# Patient Record
Sex: Female | Born: 1980
Health system: Southern US, Community
[De-identification: ages and names within clinical notes are randomized; demographics above are authoritative.]

## PROBLEM LIST (undated history)

## (undated) DIAGNOSIS — R030 Elevated blood-pressure reading, without diagnosis of hypertension: Secondary | ICD-10-CM

## (undated) DIAGNOSIS — I10 Essential (primary) hypertension: Secondary | ICD-10-CM

## (undated) DIAGNOSIS — Z8759 Personal history of other complications of pregnancy, childbirth and the puerperium: Secondary | ICD-10-CM

## (undated) DIAGNOSIS — IMO0001 Reserved for inherently not codable concepts without codable children: Secondary | ICD-10-CM

## (undated) HISTORY — PX: INDUCED ABORTION: SHX677

## (undated) HISTORY — DX: Personal history of other complications of pregnancy, childbirth and the puerperium: Z87.59

## (undated) HISTORY — DX: Essential (primary) hypertension: I10

## (undated) HISTORY — DX: Elevated blood-pressure reading, without diagnosis of hypertension: R03.0

## (undated) HISTORY — DX: Reserved for inherently not codable concepts without codable children: IMO0001

## (undated) HISTORY — PX: REDUCTION MAMMAPLASTY: SUR839

## (undated) HISTORY — PX: BREAST SURGERY: SHX581

---

## 1999-09-03 ENCOUNTER — Emergency Department (HOSPITAL_COMMUNITY): Admission: EM | Admit: 1999-09-03 | Discharge: 1999-09-03 | Payer: Self-pay | Admitting: Emergency Medicine

## 1999-09-03 ENCOUNTER — Encounter: Payer: Self-pay | Admitting: Emergency Medicine

## 2001-04-24 ENCOUNTER — Encounter (INDEPENDENT_AMBULATORY_CARE_PROVIDER_SITE_OTHER): Payer: Self-pay | Admitting: Specialist

## 2001-04-24 ENCOUNTER — Ambulatory Visit (HOSPITAL_BASED_OUTPATIENT_CLINIC_OR_DEPARTMENT_OTHER): Admission: RE | Admit: 2001-04-24 | Discharge: 2001-04-25 | Payer: Self-pay | Admitting: Plastic Surgery

## 2001-07-25 ENCOUNTER — Other Ambulatory Visit: Admission: RE | Admit: 2001-07-25 | Discharge: 2001-07-25 | Payer: Self-pay | Admitting: *Deleted

## 2002-07-25 ENCOUNTER — Other Ambulatory Visit: Admission: RE | Admit: 2002-07-25 | Discharge: 2002-07-25 | Payer: Self-pay | Admitting: *Deleted

## 2005-10-07 ENCOUNTER — Ambulatory Visit: Payer: Self-pay | Admitting: Family Medicine

## 2006-07-06 ENCOUNTER — Ambulatory Visit: Payer: Self-pay | Admitting: Family Medicine

## 2006-08-07 ENCOUNTER — Ambulatory Visit: Payer: Self-pay | Admitting: Family Medicine

## 2007-09-24 ENCOUNTER — Ambulatory Visit: Payer: Self-pay | Admitting: Internal Medicine

## 2008-01-21 ENCOUNTER — Telehealth (INDEPENDENT_AMBULATORY_CARE_PROVIDER_SITE_OTHER): Payer: Self-pay | Admitting: *Deleted

## 2008-01-21 ENCOUNTER — Ambulatory Visit: Payer: Self-pay | Admitting: Internal Medicine

## 2008-01-21 DIAGNOSIS — R030 Elevated blood-pressure reading, without diagnosis of hypertension: Secondary | ICD-10-CM | POA: Insufficient documentation

## 2008-01-22 ENCOUNTER — Telehealth: Payer: Self-pay | Admitting: Internal Medicine

## 2008-01-22 ENCOUNTER — Encounter: Payer: Self-pay | Admitting: Internal Medicine

## 2008-01-22 ENCOUNTER — Telehealth (INDEPENDENT_AMBULATORY_CARE_PROVIDER_SITE_OTHER): Payer: Self-pay | Admitting: *Deleted

## 2008-03-07 ENCOUNTER — Ambulatory Visit: Payer: Self-pay | Admitting: Internal Medicine

## 2008-03-07 DIAGNOSIS — E049 Nontoxic goiter, unspecified: Secondary | ICD-10-CM | POA: Insufficient documentation

## 2008-03-12 ENCOUNTER — Encounter (INDEPENDENT_AMBULATORY_CARE_PROVIDER_SITE_OTHER): Payer: Self-pay | Admitting: *Deleted

## 2008-03-12 LAB — CONVERTED CEMR LAB
Free T4: 0.8 ng/dL (ref 0.6–1.6)
Sed Rate: 9 mm/hr (ref 0–22)
T3 Uptake Ratio: 32.6 % (ref 22.5–37.0)
T3, Free: 3.4 pg/mL (ref 2.3–4.2)
TSH: 0.92 microintl units/mL (ref 0.35–5.50)

## 2008-03-13 ENCOUNTER — Encounter: Admission: RE | Admit: 2008-03-13 | Discharge: 2008-03-13 | Payer: Self-pay | Admitting: Internal Medicine

## 2008-03-17 ENCOUNTER — Telehealth (INDEPENDENT_AMBULATORY_CARE_PROVIDER_SITE_OTHER): Payer: Self-pay | Admitting: *Deleted

## 2008-03-25 ENCOUNTER — Encounter: Payer: Self-pay | Admitting: Internal Medicine

## 2008-03-25 ENCOUNTER — Other Ambulatory Visit: Admission: RE | Admit: 2008-03-25 | Discharge: 2008-03-25 | Payer: Self-pay | Admitting: Interventional Radiology

## 2008-03-25 ENCOUNTER — Encounter: Admission: RE | Admit: 2008-03-25 | Discharge: 2008-03-25 | Payer: Self-pay | Admitting: Internal Medicine

## 2008-03-25 ENCOUNTER — Encounter (INDEPENDENT_AMBULATORY_CARE_PROVIDER_SITE_OTHER): Payer: Self-pay | Admitting: Interventional Radiology

## 2008-04-09 ENCOUNTER — Telehealth: Payer: Self-pay | Admitting: Internal Medicine

## 2008-04-10 ENCOUNTER — Encounter (INDEPENDENT_AMBULATORY_CARE_PROVIDER_SITE_OTHER): Payer: Self-pay | Admitting: *Deleted

## 2008-05-27 ENCOUNTER — Encounter: Payer: Self-pay | Admitting: Internal Medicine

## 2008-06-23 ENCOUNTER — Ambulatory Visit: Payer: Self-pay | Admitting: Internal Medicine

## 2008-06-23 DIAGNOSIS — J4520 Mild intermittent asthma, uncomplicated: Secondary | ICD-10-CM | POA: Insufficient documentation

## 2008-06-27 ENCOUNTER — Telehealth: Payer: Self-pay | Admitting: Internal Medicine

## 2008-07-07 ENCOUNTER — Encounter: Payer: Self-pay | Admitting: Internal Medicine

## 2008-07-30 ENCOUNTER — Ambulatory Visit: Payer: Self-pay | Admitting: Family Medicine

## 2008-08-29 ENCOUNTER — Ambulatory Visit (HOSPITAL_COMMUNITY): Admission: RE | Admit: 2008-08-29 | Discharge: 2008-08-30 | Payer: Self-pay | Admitting: General Surgery

## 2008-08-29 ENCOUNTER — Encounter (INDEPENDENT_AMBULATORY_CARE_PROVIDER_SITE_OTHER): Payer: Self-pay | Admitting: General Surgery

## 2008-08-29 ENCOUNTER — Encounter: Payer: Self-pay | Admitting: Internal Medicine

## 2008-09-15 ENCOUNTER — Encounter: Payer: Self-pay | Admitting: Internal Medicine

## 2008-10-30 HISTORY — PX: THYROIDECTOMY: SHX17

## 2008-11-04 ENCOUNTER — Ambulatory Visit: Payer: Self-pay | Admitting: Family Medicine

## 2009-01-02 ENCOUNTER — Ambulatory Visit: Payer: Self-pay | Admitting: Family Medicine

## 2009-05-21 ENCOUNTER — Telehealth (INDEPENDENT_AMBULATORY_CARE_PROVIDER_SITE_OTHER): Payer: Self-pay | Admitting: *Deleted

## 2009-05-22 ENCOUNTER — Encounter (INDEPENDENT_AMBULATORY_CARE_PROVIDER_SITE_OTHER): Payer: Self-pay | Admitting: *Deleted

## 2009-05-28 ENCOUNTER — Ambulatory Visit: Payer: Self-pay | Admitting: Family Medicine

## 2009-05-28 DIAGNOSIS — E890 Postprocedural hypothyroidism: Secondary | ICD-10-CM | POA: Insufficient documentation

## 2009-05-28 DIAGNOSIS — F32A Depression, unspecified: Secondary | ICD-10-CM | POA: Insufficient documentation

## 2009-05-28 DIAGNOSIS — F329 Major depressive disorder, single episode, unspecified: Secondary | ICD-10-CM

## 2009-05-28 DIAGNOSIS — F419 Anxiety disorder, unspecified: Secondary | ICD-10-CM

## 2009-05-29 ENCOUNTER — Encounter: Payer: Self-pay | Admitting: Family Medicine

## 2009-05-29 LAB — CONVERTED CEMR LAB: TSH: 4.63 microintl units/mL (ref 0.35–5.50)

## 2009-06-25 ENCOUNTER — Telehealth (INDEPENDENT_AMBULATORY_CARE_PROVIDER_SITE_OTHER): Payer: Self-pay | Admitting: *Deleted

## 2009-10-19 ENCOUNTER — Ambulatory Visit: Payer: Self-pay | Admitting: Family Medicine

## 2009-10-19 ENCOUNTER — Telehealth (INDEPENDENT_AMBULATORY_CARE_PROVIDER_SITE_OTHER): Payer: Self-pay | Admitting: *Deleted

## 2009-10-20 LAB — CONVERTED CEMR LAB: TSH: 2.05 microintl units/mL (ref 0.35–5.50)

## 2010-01-07 ENCOUNTER — Ambulatory Visit: Payer: Self-pay | Admitting: Family Medicine

## 2010-01-25 ENCOUNTER — Telehealth (INDEPENDENT_AMBULATORY_CARE_PROVIDER_SITE_OTHER): Payer: Self-pay | Admitting: *Deleted

## 2010-01-25 ENCOUNTER — Ambulatory Visit: Payer: Self-pay | Admitting: Family Medicine

## 2010-01-28 LAB — CONVERTED CEMR LAB: TSH: 3.73 microintl units/mL (ref 0.35–5.50)

## 2010-10-03 ENCOUNTER — Encounter: Payer: Self-pay | Admitting: Endocrinology

## 2010-10-12 NOTE — Assessment & Plan Note (Signed)
Summary: ? SINUS INF/RH........Marland Kitchen   Vital Signs:  Patient profile:   30 year old female Height:      68 inches Weight:      234 pounds BMI:     35.71 Temp:     98.4 degrees F oral Pulse rate:   88 / minute Pulse rhythm:   regular BP sitting:   126 / 80  (left arm) Cuff size:   large  Vitals Entered By: Army Fossa CMA (January 07, 2010 1:49 PM) CC: Pt here for HA, sinus pressure, congestion x 2 days. , URI symptoms   History of Present Illness:       This is a 30 year old woman who presents with URI symptoms.  The symptoms began 4 days ago.  The patient complains of nasal congestion, purulent nasal discharge, productive cough, earache, and sick contacts.  The patient denies fever, low-grade fever (<100.5 degrees), fever of 100.5-103 degrees, fever of 103.1-104 degrees, fever to >104 degrees, stiff neck, dyspnea, wheezing, rash, vomiting, diarrhea, use of an antipyretic, and response to antipyretic.  The patient also reports itchy watery eyes and headache.  The patient denies itchy throat, sneezing, seasonal symptoms, response to antihistamine, muscle aches, and severe fatigue.  The patient denies the following risk factors for Strep sinusitis: unilateral facial pain, unilateral nasal discharge, poor response to decongestant, double sickening, tooth pain, Strep exposure, tender adenopathy, and absence of cough.  Pt taking zyrtec with litttle relief and b/l sinus pressure.  Allergies: 1)  ! 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 CAD - F (deceased age 40) HTN - M family stroke - F DM - M aunt breast Ca - no lung Ca - great uncle colon Ca - no ovarian/uterine Ca -no thyroid dz - no  Social History: Reviewed history from 04/06/08 and no changes required. 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   Ears:  External ear exam shows no significant lesions or deformities.  Otoscopic examination reveals clear canals, tympanic membranes are intact bilaterally without bulging, retraction, inflammation or discharge. Hearing is grossly normal bilaterally. Nose:  L frontal sinus tenderness, L maxillary sinus tenderness, R frontal sinus tenderness, and R maxillary sinus tenderness.   Mouth:  Oral mucosa and oropharynx without lesions or exudates.  Teeth in good repair. Neck:  cervical lymphadenopathy.   Lungs:  Normal respiratory effort, chest expands symmetrically. Lungs are clear to auscultation, no crackles or wheezes. Heart:  normal rate and no murmur.   Psych:  Oriented X3 and normally interactive.     Impression & Recommendations:  Problem # 1:  SINUSITIS- ACUTE-NOS (ICD-461.9)  The following medications were removed from the medication list:    Tussionex Pennkinetic Er 8-10 Mg/36ml Lqcr (Chlorpheniramine-hydrocodone) .Marland Kitchen... 1 tsp by mouth at bedtime  as needed Her updated medication list for this problem includes:    Flonase 50 Mcg/act Susp (Fluticasone propionate) .Marland Kitchen... 2 sprays each nostril once daily    Augmentin 875-125 Mg Tabs (Amoxicillin-pot clavulanate) .Marland Kitchen... 1 by mouth two times a day    Cheratussin Ac 100-10 Mg/28ml Syrp (Guaifenesin-codeine) .Marland Kitchen...  1-2 tsp by mouth at bedtime as needed cough  Instructed on treatment. Call if symptoms persist or worsen.   Complete Medication List: 1)  Flonase 50 Mcg/act Susp (Fluticasone propionate) .... 2 sprays each nostril once daily 2)  Synthroid 125 Mcg Tabs (Levothyroxine sodium) .... Take 1 tablet by mouth once a day 3)  Zyrtec Allergy 10 Mg Tabs (Cetirizine hcl) .... Take one tablet daily as needed. 4)  Allergy Shots  5)  Bcp  6)  Augmentin 875-125 Mg Tabs (Amoxicillin-pot clavulanate) .Marland Kitchen.. 1 by mouth two times a day 7)  Cheratussin Ac 100-10 Mg/39ml Syrp (Guaifenesin-codeine) .Marland Kitchen.. 1-2 tsp by mouth at bedtime as needed cough Prescriptions: CHERATUSSIN AC 100-10 MG/5ML SYRP (GUAIFENESIN-CODEINE) 1-2 tsp by mouth at bedtime as needed cough  #6 oz x 0   Entered and Authorized by:   Loreen Freud DO   Signed by:   Loreen Freud DO on 01/07/2010   Method used:   Print then Give to Patient   RxID:   [id number] AUGMENTIN 875-125 MG TABS (AMOXICILLIN-POT CLAVULANATE) 1 by mouth two times a day  #20 x 0   Entered and Authorized by:   Loreen Freud DO   Signed by:   Loreen Freud DO on 01/07/2010   Method used:   Electronically to        [facility] [phone]* (retail)       [address]       [place], [place]  [address]       Ph: [phone]       Fax: [phone]   RxID:   [id number] FLONASE 50 MCG/ACT SUSP (FLUTICASONE PROPIONATE) 2 sprays each nostril once daily  #1 x 5   Entered and Authorized by:   Loreen Freud DO   Signed by:   Loreen Freud DO on 01/07/2010   Method used:   Electronically to        [facility] [phone]* (retail)       [address]       [place], [place]  [address]       Ph: [phone]       Fax: [phone]   RxID:   [id number]

## 2010-10-12 NOTE — Progress Notes (Signed)
Summary: refill synthroid with tsh results  Phone Note Call from Patient Call back at Work Phone [phone]   Caller: Patient Reason for Call: Refill Medication Summary of Call: PT. CAME IN AND WANTING [place] TO KNOW THAT SHE HAD HER THYROID CHECK. NOW SHE NEED ED HER REFILL CALL IN.  Initial call taken by: Freddy Jaksch,  October 19, 2009 2:40 PM  Follow-up for Phone Call        will refill when we get results Follow-up by: Shary Decamp,  October 19, 2009 4:53 PM  Additional Follow-up for Phone Call Additional follow up Details #1::        con't same dose  Additional Follow-up by: Loreen Freud DO,  October 20, 2009 4:44 PM    Additional Follow-up for Phone Call Additional follow up Details #2::    left message for pt- sent in rx's. Army Fossa CMA  October 20, 2009 4:53 PM   Prescriptions: SYNTHROID 125 MCG TABS (LEVOTHYROXINE SODIUM) Take 1 tablet by mouth once a day  #30 x 2   Entered by:   Army Fossa CMA   Authorized by:   Loreen Freud DO   Signed by:   Army Fossa CMA on 10/20/2009   Method used:   Electronically to        [facility] [phone]* (retail)       [address]       [place], [place]  [address]       Ph: [phone]       Fax: [phone]   RxID:   [id number]

## 2010-10-12 NOTE — Progress Notes (Signed)
Summary: Lab Work  Phone Note Call from Patient Call back at [employer] [phone]   Caller: Patient Reason for Call: Lab or Test Results Summary of Call: Patient was going to her pharmacy to get her synthriod filled. They said she needed blood work done. Just a TSH? and what code? Initial call taken by: Harold Barban,  Jan 25, 2010 9:44 AM  Follow-up for Phone Call        tsh 244.9 Follow-up by: Shary Decamp,  Jan 25, 2010 4:57 PM

## 2010-10-19 ENCOUNTER — Ambulatory Visit: Payer: Self-pay | Admitting: Internal Medicine

## 2010-10-20 ENCOUNTER — Encounter: Payer: Self-pay | Admitting: Internal Medicine

## 2010-10-20 ENCOUNTER — Ambulatory Visit (INDEPENDENT_AMBULATORY_CARE_PROVIDER_SITE_OTHER): Payer: BC Managed Care – PPO | Admitting: Internal Medicine

## 2010-10-20 DIAGNOSIS — J45909 Unspecified asthma, uncomplicated: Secondary | ICD-10-CM

## 2010-10-22 ENCOUNTER — Ambulatory Visit: Payer: Self-pay | Admitting: Family Medicine

## 2010-10-28 NOTE — Assessment & Plan Note (Signed)
Summary: sinus, cough///sph-rescd/cbs  Nurse Visit   Vital Signs:  Patient profile:   30 year old female Height:      68 inches Weight:      202.25 pounds BMI:     30.86 Temp:     98.1 degrees F oral Pulse rate:   78 / minute Pulse rhythm:   regular BP sitting:   130 / 84  (left arm) Cuff size:   large  Vitals Entered By: Army Fossa CMA (October 20, 2010 4:01 PM)  History of Present Illness: started with respiratory symptoms 5 days ago Cough with some yellow sputum, sinus pressure, unable to sleep due to cough in the last couple of days  ROS No fever Had sore throat last week but that is better Her asthma is usually well controlled, she only develops problems when she has a  infection. At this point she does feel chest congestion and the feels like she " could use albuterol" no nausea, vomiting, diarrhea   Physical Exam  General:  alert and well-developed.  no apparent distress Head:  face symmetric Ears:  R ear normal and L ear normal.   Nose:  slightly congested Mouth:  no redness or discharge Lungs:  normal respiratory effort, no intercostal retractions, and no accessory muscle use.  slightly decreased breath sounds but no wheezing, crackles, no increase work of breathing Heart:  normal rate and no murmur.     Impression & Recommendations:  Problem # 1:  ASTHMA (ICD-493.90)  bronchitis with a mild asthma exacerbation. see instructions   Her updated medication list for this problem includes:    Ventolin Hfa 108 (90 Base) Mcg/act Aers (Albuterol sulfate) .Marland Kitchen... 2 puffs every 6 hours as needed wheezing  Complete Medication List: 1)  Flonase 50 Mcg/act Susp (Fluticasone propionate) .... 2 sprays each nostril once daily 2)  Synthroid 125 Mcg Tabs (Levothyroxine sodium) .... Take 1 tablet by mouth once a day 3)  Zyrtec Allergy 10 Mg Tabs (Cetirizine hcl) .... Take one tablet daily as needed. 4)  Allergy Shots  5)  Bcp  6)  Zithromax Z-pak 250 Mg Tabs  (Azithromycin) .... As direceted 7)  Hydromet 5-1.5 Mg/28ml Syrp (Hydrocodone-homatropine) .Marland Kitchen.. 1 tsp by mouth every 6 hours as needed for cough 8)  Ventolin Hfa 108 (90 Base) Mcg/act Aers (Albuterol sulfate) .... 2 puffs every 6 hours as needed wheezing   Patient Instructions: 1)  rest, fluids, Tylenol 2)  Mucinex DM as needed for cough 3)  Hydrocodone if the cough is severe , will cause drowsiness 4)  Zithromax for 5 days 5)   albuterolas needed for wheezing 6)  Call if not better in a few days  CC: Sinus Infection?  Comments x 3weeks coughing up mucus (green) taking OTC meds Can she get B12 injection today? [facility] [address]    Current Medications (verified): 1)  Flonase 50 Mcg/act Susp (Fluticasone Propionate) .... 2 Sprays Each Nostril Once Daily 2)  Synthroid 125 Mcg Tabs (Levothyroxine Sodium) .... Take 1 Tablet By Mouth Once A Day 3)  Zyrtec Allergy 10 Mg Tabs (Cetirizine Hcl) .... Take One Tablet Daily As Needed. 4)  Allergy Shots 5)  Bcp  Allergies (verified): 1)  ! Sulfa  Past History:  Past Medical History: Reviewed history from 06/23/2008 and no changes required. asthma elevated BP G3 P0 abortion x 3  Past Surgical History: Reviewed history from 11/04/2008 and no changes required. 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 Patient Level III [16109] Prescriptions: ZITHROMAX Z-PAK 250 MG TABS (AZITHROMYCIN) as direceted  #1 x 0   Entered and Authorized by:   Nolon Rod. Paz MD   Signed by:   Nolon Rod. Paz MD on 10/20/2010   Method used:   Print then Give to Patient   RxID:   [id number] VENTOLIN HFA 108 (90 BASE) MCG/ACT AERS (ALBUTEROL SULFATE) 2 puffs every 6 hours as needed wheezing  #1 x 1   Entered and Authorized by:   Nolon Rod. Paz MD   Signed by:   Nolon Rod. Paz MD on 10/20/2010   Method used:   Print then Give to Patient   RxID:    [id number] HYDROMET 5-1.5 MG/5ML SYRP (HYDROCODONE-HOMATROPINE) 1 tsp by mouth every 6 hours as needed for cough  #150cc x 0   Entered and Authorized by:   Nolon Rod. Paz MD   Signed by:   Nolon Rod. Paz MD on 10/20/2010   Method used:   Print then Give to Patient   RxID:   [id number]

## 2011-01-12 ENCOUNTER — Encounter: Payer: Self-pay | Admitting: Family Medicine

## 2011-01-13 ENCOUNTER — Encounter: Payer: Self-pay | Admitting: Family Medicine

## 2011-01-13 ENCOUNTER — Ambulatory Visit (INDEPENDENT_AMBULATORY_CARE_PROVIDER_SITE_OTHER): Payer: BC Managed Care – PPO | Admitting: Family Medicine

## 2011-01-13 VITALS — BP 98/68 | Temp 97.9°F | Wt 205.6 lb

## 2011-01-13 DIAGNOSIS — Z889 Allergy status to unspecified drugs, medicaments and biological substances status: Secondary | ICD-10-CM

## 2011-01-13 DIAGNOSIS — J329 Chronic sinusitis, unspecified: Secondary | ICD-10-CM

## 2011-01-13 DIAGNOSIS — E039 Hypothyroidism, unspecified: Secondary | ICD-10-CM

## 2011-01-13 DIAGNOSIS — Z9109 Other allergy status, other than to drugs and biological substances: Secondary | ICD-10-CM

## 2011-01-13 MED ORDER — AMOXICILLIN-POT CLAVULANATE 875-125 MG PO TABS: 1.0000 | ORAL_TABLET | Freq: Two times a day (BID) | ORAL | Status: AC

## 2011-01-13 MED ORDER — FLUTICASONE PROPIONATE 50 MCG/ACT NA SUSP: 2.0000 | Freq: Every day | NASAL | Status: DC

## 2011-01-13 NOTE — Progress Notes (Signed)
  Subjective:     Erin [name] is a 30 y.o. female who presents for evaluation of sinus pain. Symptoms include: congestion, facial pain, nasal congestion and sinus pressure. Onset of symptoms was 1 month ago. Symptoms have been gradually worsening since that time. Past history is significant for allergies. Patient is a non-smoker.  The following portions of the patient's history were reviewed and updated as appropriate: allergies, current medications, past family history, past medical history, past social history, past surgical history and problem list.  Review of Systems Pertinent items are noted in HPI.   Objective:    BP 98/68  Temp(Src) 97.9 F (36.6 C) (Oral)  Wt 205 lb 9.6 oz (93.26 kg) General appearance: alert, cooperative, appears stated age and no distress Ears: normal TM's and external ear canals both ears Nose: Nares normal. Septum midline. Mucosa normal. No drainage or sinus tenderness., sinus tenderness bilateral Throat: lips, mucosa, and tongue normal; teeth and gums normal Neck: no adenopathy, no carotid bruit, no JVD, supple, symmetrical, trachea midline and thyroid not enlarged, symmetric, no tenderness/mass/nodules Lungs: clear to auscultation bilaterally Lymph nodes: Cervical, supraclavicular, and axillary nodes normal.    Assessment:    Acute bacterial sinusitis.    Plan:    Nasal steroids per medication orders. Antihistamines per medication orders. Augmentin per medication orders.

## 2011-01-14 ENCOUNTER — Encounter: Payer: Self-pay | Admitting: Family Medicine

## 2011-01-17 ENCOUNTER — Encounter: Payer: Self-pay | Admitting: *Deleted

## 2011-01-25 ENCOUNTER — Other Ambulatory Visit: Payer: Self-pay | Admitting: Family Medicine

## 2011-01-25 NOTE — Op Note (Signed)
NAME:  Erin [name], Erin [name]                 ACCOUNT NO.:  [id number]   MEDICAL RECORD NO.:  [id number]          PATIENT TYPE:  OIB   LOCATION:  1528                         FACILITY:  [facility]   PHYSICIAN:  Adolph Pollack, M.D.DATE OF BIRTH:  06/21/1981   DATE OF PROCEDURE:  08/29/2008  DATE OF DISCHARGE:                               OPERATIVE REPORT   PREOPERATIVE DIAGNOSIS:  Multinodular goiter with left-sided follicular  neoplasm.   POSTOPERATIVE DIAGNOSIS:  Multinodular goiter with left-sided follicular  neoplasm.   OPERATION PERFORMED:  Total thyroidectomy.   SURGEON:  Adolph Pollack, M.D.   ASSISTANT:  Wilmon Arms. Tsuei, M.D.   ANESTHESIA:  General.   INDICATIONS:  This 30 year old female has a known multinodular goiter  and had one suspicious left upper pole nodule noted that was biopsied  and was consistent with a follicular neoplasm with the possibility of a  follicular variant of a  papillary carcinoma.  She thus presents for  total thyroidectomy given multinodular goiter as well.   TECHNIQUE:  The patient was brought to the operating room, placed supine  on the operating table and a general anesthetic was administered.  The  neck was placed in slight extension.  The neck and upper chest were  sterilely prepped and draped.  A transverse incision was made in the  lower neck through the skin, subcutaneous tissue and platysma muscle.  Subplatysmal flaps were raised to the thyroid cartilage superiorly and  down to the suprasternal notch inferiorly.  The fascia between the right  left strap muscle was identified and divided.  The strap muscle then  bluntly dissected free from the enlarged left lobe of the thyroid gland.  I began at the inferior aspect of the thyroid gland and mobilized the  thyroid gland with dissection on the gland, and dividing the inferior  thyroidal vessels with hemoclips and the harmonic scalpel.  I then  mobilized the superior pole of thyroid gland,  which was quite superior,  dividing the small vessels between clips and ties, and using the  harmonic scalpel.  During this dissection I saw a superior parathyroid  gland, which I preserved.   This lobe of the left thyroid gland was quite large.  I then identified  the medial thyroidal vessels, dividing them close to the thyroid gland  with the clips and the harmonic scalpel.  These dropped away and I  identified the left recurrent laryngeal nerve, which I traced underneath  the vessels and it did not appear to be injured.  After doing this I  then dissected the enlarged left lobe of the thyroid gland free from the  trachea using electrocautery across the midline.  I had marked the  superior lobe with a long 2-0 silk suture on the left side.  Hemostasis  was adequate at this time and I placed a dry sponge in the left side of  the neck.   I then approached the right side of the neck and dissected the strap  muscle free from a moderately enlarged right lobe of the thyroid gland  using blunt dissection.  Beginning inferiorly I dissected the thyroid  gland free from the inferior thyroidal vessels, staying close to the  thyroid gland.  It was quite multinodular.   After mobilizing the inferior aspect the gland I approached the superior  pole, identified superior pole vessels and divided these between clips  and ties mobilizing this portion.  Staying on the thyroid gland I  identified the middle thyroidal vessels and divided these between clips  and using a harmonic scalpel.  I identified a superior parathyroid  gland, but was not able to identify an inferior parathyroid gland on the  right side similar to the left side.  The recurrent laryngeal nerve was  identified fairly close to a small lip of thyroid tissue.  Using  electrocautery and I then dissected the thyroid gland free from the  trachea leaving a very small amount of thyroid tissue behind where it  was close to the recurrent  laryngeal nerve, which was intact.  The  specimen was subsequently handed off the field.   I placed a dry sponge into the right side of the neck and reinspected  the left side of the neck.  There was a small amount of bleeding from a  vein very close to the recurrent laryngeal nerve and I stopped this with  direct pressure and Surgicel.  This was after wound irrigation.  No  further bleeding was noted from this area.   I then approached the right side, irrigated and it was hemostatic.  Surgicel was likewise placed in the right side.  No bleeding was noted  at this time from either side.   I then reapproximated the strap muscles with interrupted 3-0 Vicryl  sutures.  I irrigated out the subcutaneous tissue and a few bleeding  points were stopped with cautery.  I then reapproximated the platysma  muscle with interrupted 3-0 Vicryl sutures.  The skin was closed with a  4-0 Monocryl subcuticular stitch.  Steri-Strips and sterile dressing  were applied.   The patient tolerated the procedure without any apparent complications  and was taken to the recovery in satisfactory condition.      Adolph Pollack, M.D.  Electronically Signed     TJR/MEDQ  D:  08/29/2008  T:  08/30/2008  Job:  [id number]   cc:   Dorisann Frames, M.D.  Fax: [phone]   Willow Ora, MD  [phone] [address] [place], [place] [address]

## 2011-01-28 NOTE — Op Note (Signed)
[facility]  Patient:    [name], [name]                        MRN: [id number] Proc. Date: 04/24/01 Adm. Date:  [id number] Attending:  Eloise Levels                           Operative Report  PREOPERATIVE DIAGNOSIS:  Bilateral macromastia.  POSTOPERATIVE DIAGNOSIS:  Bilateral macromastia.  PROCEDURE:  Bilateral reduction mammoplasty.  SURGEON:  Mary A. Contogiannis, M.D.  ASSISTANT:  Alethia Berthold, C.F.A.  ANESTHESIA:  General endotracheal.  ANESTHESIOLOGIST:  Janetta Hora. Gelene Mink, M.D.  ESTIMATED BLOOD LOSS:  150 cc.  FLUID REPLACEMENT:  1800 cc crystalloid.  URINE OUTPUT:  600 cc.  COMPLICATIONS:  None.  AMOUNT OF BREAST TISSUE REMOVED:  Right breast, 1188 g; left breast, 1258 g.  INDICATIONS FOR OVERNIGHT STAY:  Progressive pain control along with ambulation and monitoring of the breast flaps and nipples.  INDICATIONS FOR THE PROCEDURE:  The patient is a 30 year old African-American female who presents with bilateral macromastia, neck aches, headaches, backaches, shoulder strap groove marks, and pain.  She requested to proceed with bilateral reduction mammoplasties at this time.  DESCRIPTION OF PROCEDURE:  The patient was marked in the preop holding area in the pattern of Wise for the future reduction mammoplasties.  She requested that she have slightly larger than normal nipples, and we discussed the size in the holding area.  The patient was then taken back to the operating room and placed on the OR table in a supine position.  After adequate general endotracheal anesthesia was obtained, the patients chest was prepped with Betadine and alcohol and draped in a sterile fashion.  The base of the breasts along with the skin incisions were then injected with 1% lidocaine with epinephrine.  After adequate hemostasis had taken effect, the procedure was begun.  First the right breast was performed.  The nipple was marked with  the 45 mm nipple marker.  This was then incised and the skin de-epithelialized down to the inframammary crease in inferior pedicle pattern.  Next the medial, superior, and lateral skin flaps were elevated down to the chest wall, then slightly off the chest wall for better shaping of the breasts.  The excess fat and glandular tissue were then removed from the inferior pedicle.  There were some dense fibrocystic changes present.  The nipple was pink and viable after this was done.  The wound was then irrigated with saline irrigation. Meticulous hemostasis was obtained with the Bovie electrocautery.  The inferior pedicle was centralized using 3-0 Prolene sutures.  A #10 JP flat, fully-fluted drain was then placed into the wound.  The skin flaps were brought together at the inverted T junction with a 2-0 Prolene suture.  The skin incisions were then stapled for temporary closure.  Attention was then turned to the left breast.  The nipple was marked with a 45 mm nipple marker, and this was incised and the skin de-epithelialized down to the inframammary crease in the inferior pedicle pattern.  Next, the medial, superior, and lateral skin flaps were elevated down to the chest wall and slightly off the chest wall for better shaping of the breasts.  Excess fat and glandular tissue was then removed from the inferior pedicle.  The nipple was pink and viable after this was done.  There was also dense fibrocystic disease  present in this breast.  The wound was then irrigated with saline irrigation.  Meticulous hemostasis was obtained with the Bovie electrocautery.  The inferior pedicle was centralized using 3-0 Prolene suture.  A #10 JP flat, fully-fluted drain was then placed into the wound.  The skin flaps were brought together in the inverted T junction with a 2-0 Prolene suture.  The incision was stapled for temporary closure.  The breasts were compared and found to have a good shape and symmetry.   The skin incisions were then closed, first on the right breast, followed by the left breast.  The dermal layer was closed with a 3-0 Monocryl suture, followed by a 4-0 Monocryl running intracuticular stitch on the skin. The patient was then placed in the upright position.  The area for the future nipples was then marked on the breast mounds with a 45 mm nipple marker.  She was then placed back in the recumbent position.  The patient said that she wanted her nipples to be between 4-5 cm in diameter approximately.  When I had marked her initially with the 45 mm nipple marker, I had gone a couple of millimeters outside of this in incising the nipple, as this was our largest nipple marker that we had.  The area for the future nipple on the right breast mound was first incised.  This was excised full-thickness through the skin into the subcutaneous tissues with the Bovie electrocautery.  The nipple was then brought into the aperture and sewn in place using 4-0 Monocryl interrupted dermal sutures and a 5-0 Monocryl running intracuticular stitch on the skin.  In a likewise fashion, the area for the future nipple-areolar complex was then incised on the left breast mound.  This tissue was excised full-thickness with the Bovie electrocautery.  The nipple was then brought into this aperture and sewn in place using 4-0 Monocryl interrupted dermal sutures, then a 5-0 Monocryl running intracuticular stitch on the skin.  In a likewise fashion, the area for the future nipple-areolar complex was then incised on the left breast mound.  This tissue was excised full-thickness with the Bovie electrocautery.  The nipple was then brought into this aperture and sewn in place using 4-0 Monocryl interrupted dermal sutures, followed by 5-0 Monocryl running intracuticular stitch on the skin.  The JP drains were sewn in place using 3-0 nylon sutures.  The incisions were dressed with benzoin and Steri-Strips and the  nipples further with bacitracin ointment and Adaptic. Four by four gauze was then placed over all of this.  She was then placed in a light postoperative support bra.  There were no complications.  The patient  tolerated the procedure well.  She was then extubated and taken to the recovery room in stable condition.  The final needle and sponge counts were reported to be correct at the end of the case.  She will stay overnight in the [facility] for progressive pain control, ambulation, as well as monitoring of the breast flaps and nipples.  Total amount of breast tissue removed:  Right breast, 1188 g; left breast, 1258 g. DD:  04/24/01 TD:  04/24/01 Job: [id number]

## 2011-02-21 ENCOUNTER — Encounter: Payer: Self-pay | Admitting: Internal Medicine

## 2011-02-24 ENCOUNTER — Institutional Professional Consult (permissible substitution): Payer: BC Managed Care – PPO | Admitting: Internal Medicine

## 2011-03-31 ENCOUNTER — Encounter: Payer: Self-pay | Admitting: Internal Medicine

## 2011-04-01 ENCOUNTER — Encounter: Payer: Self-pay | Admitting: Internal Medicine

## 2011-04-01 ENCOUNTER — Ambulatory Visit (INDEPENDENT_AMBULATORY_CARE_PROVIDER_SITE_OTHER): Payer: BC Managed Care – PPO | Admitting: Internal Medicine

## 2011-04-01 VITALS — BP 120/78 | HR 92 | Ht 68.0 in | Wt 211.4 lb

## 2011-04-01 DIAGNOSIS — J45909 Unspecified asthma, uncomplicated: Secondary | ICD-10-CM

## 2011-04-01 DIAGNOSIS — J309 Allergic rhinitis, unspecified: Secondary | ICD-10-CM

## 2011-04-01 DIAGNOSIS — G4489 Other headache syndrome: Secondary | ICD-10-CM

## 2011-04-01 DIAGNOSIS — J3089 Other allergic rhinitis: Secondary | ICD-10-CM | POA: Insufficient documentation

## 2011-04-01 DIAGNOSIS — G44009 Cluster headache syndrome, unspecified, not intractable: Secondary | ICD-10-CM

## 2011-04-01 DIAGNOSIS — J302 Other seasonal allergic rhinitis: Secondary | ICD-10-CM

## 2011-04-01 NOTE — Assessment & Plan Note (Signed)
Return for allergy skin testing Because of drowsiness, change from cetirizine to fexofenadine.

## 2011-04-01 NOTE — Patient Instructions (Signed)
Try changing your antihistamine from cetirizine/ Zyrtec to fexofenadine/ Allegra, which is not sedating  Consider putting dust mite encasings on your pillow and mattress  Schedule return for allergy skin testing-   Antihistamines directly block the tests, so we will need you off the cetirizine/ fexofenadine as well as any otc cough, cold or allergy products, otc sleep meds, for 3 days before your tests. Please call if you have any questions.

## 2011-04-01 NOTE — Progress Notes (Signed)
Subjective:    Patient ID: Erin [name], female    DOB: 01/18/1981, 30 y.o.   MRN: [id number]  HPI 04/01/11- 29 yoF never smoker, seen at kind request of Dr Laury Axon for allergy evaluation.  She had been followed at [facility] by Dr Corinda Gubler, where skin tests showed 4+ reactions to grasses, dust/ mites, and also oyster, shrimp, clam, crab, trees and weeds. She had done well on allergy vaccine 3 years PTA, dropping off 1.5 years ago, without reactions to the shots.  She has changed jobs and wishes to change care here because we are closer.  She describes childhood allergic rhinitis and exertional asthma. Got worse as an adult. No recent asthma and has never needed ER for asthma.  Complains of rhinorhea, nasal congestion, waking with retroorbital pressure, frontal headaches. Neti pot helps. Nasal sprays help if used. Using cetirizine. Recent thyroidectomy, otherwise no ENT surgery.  Triggers- seafood restaurants- shell fish, shrimp, oysters make throat swell.  Spring pollens, rain/ humid weather. She questions her foam pillow. 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  Review of Systems Constitutional:   No-, night sweats, fevers, chills, fatigue, lassitude. Dieted/ exercised off 40 lbs. HEENT:   No-  difficulty swallowing, tooth/dental problems, sore throat,                CV:  No-   chest pain, orthopnea, PND, swelling in lower extremities, anasarca, dizziness, palpitations  GI:  No-   heartburn, indigestion, abdominal pain, nausea, vomiting, diarrhea,                 change in bowel habits, loss of appetite  Resp: No-   shortness of breath with exertion or at rest.  No-  excess mucus,             No-   productive cough,  No non-productive cough,  No-  coughing up of blood.              No-   change in color of mucus.  No- wheezing.    Skin: No-   rash or lesions.  GU: No-   dysuria, change in color of urine, no urgency or  frequency.  No- flank pain.  MS:  No-   joint pain or swelling.  No- decreased range of motion.  No- back pain.  Psych:  No- change in mood or affect. No depression or anxiety.  No memory loss.      Objective:   Physical Exam General- Alert, Oriented, Affect-appropriate, Distress- none acute    overweight Skin- rash-none, lesions- none, excoriation- none Lymphadenopathy- none Head- atraumatic            Eyes- Gross vision intact, PERRLA, conjunctivae clear secretions   proptosis            Ears- Hearing, canals            Nose- Turbinate edema, No- Septal dev, mucus, polyps, erosion, perforation             Throat- Mallampati II , mucosa clear , drainage- none, tonsils- atrophic               Healing thyroid incision scar Neck- flexible , trachea midline, no stridor , thyroid nl, carotid no bruit Chest - symmetrical excursion , unlabored           Heart/CV- RRR , no murmur , no gallop  , no rub, nl s1  s2                           - JVD- none , edema- none, stasis changes- none, varices- none           Lung- clear to P&A, wheeze- none, cough- none , dullness-none, rub- none           Chest wall-  Abd- tender-no, distended-no, bowel sounds-present, HSM- no Br/ Gen/ Rectal- Not done, not indicated Extrem- cyanosis- none, clubbing, none, atrophy- none, strength- nl Neuro- grossly intact to observation         Assessment & Plan:

## 2011-04-03 DIAGNOSIS — G4489 Other headache syndrome: Secondary | ICD-10-CM | POA: Insufficient documentation

## 2011-04-03 NOTE — Assessment & Plan Note (Signed)
Continue to avoid foods that cause problems. Consider Epipen and food diary. We will discuss this further.

## 2011-04-03 NOTE — Assessment & Plan Note (Signed)
Controlled and now mild, intermittent.

## 2011-04-13 ENCOUNTER — Ambulatory Visit: Payer: BC Managed Care – PPO | Admitting: Internal Medicine

## 2011-06-17 LAB — CBC
HCT: 39.5 % (ref 36.0–46.0)
Hemoglobin: 13.5 g/dL (ref 12.0–15.0)
MCHC: 34.2 g/dL (ref 30.0–36.0)
MCV: 84 fL (ref 78.0–100.0)
Platelets: 176 10*3/uL (ref 150–400)
RDW: 13.9 % (ref 11.5–15.5)

## 2011-06-17 LAB — COMPREHENSIVE METABOLIC PANEL
ALT: 12 U/L (ref 0–35)
AST: 17 U/L (ref 0–37)
Albumin: 3.8 g/dL (ref 3.5–5.2)
Calcium: 9.4 mg/dL (ref 8.4–10.5)
Creatinine, Ser: 0.86 mg/dL (ref 0.4–1.2)
GFR calc Af Amer: >60 mL/min (ref >60)
Sodium: 141 mEq/L (ref 135–145)
Total Protein: 6.8 g/dL (ref 6.0–8.3)

## 2011-06-17 LAB — DIFFERENTIAL
Basophils Absolute: 0.1 10*3/uL (ref 0.0–0.1)
Eosinophils Absolute: 0.2 10*3/uL (ref 0.0–0.7)
Lymphs Abs: 2.1 10*3/uL (ref 0.7–4.0)
Monocytes Absolute: 0.6 10*3/uL (ref 0.1–1.0)
Monocytes Relative: 7 % (ref 3–12)
Neutro Abs: 6.2 10*3/uL (ref 1.7–7.7)

## 2011-06-17 LAB — PREGNANCY, URINE: Preg Test, Ur: NEGATIVE

## 2011-07-29 ENCOUNTER — Encounter: Payer: Self-pay | Admitting: Family Medicine

## 2011-07-29 ENCOUNTER — Ambulatory Visit (INDEPENDENT_AMBULATORY_CARE_PROVIDER_SITE_OTHER): Payer: BC Managed Care – PPO | Admitting: Family Medicine

## 2011-07-29 DIAGNOSIS — J329 Chronic sinusitis, unspecified: Secondary | ICD-10-CM | POA: Insufficient documentation

## 2011-07-29 MED ORDER — AZITHROMYCIN 250 MG PO TABS: 250.0000 mg | ORAL_TABLET | Freq: Every day | ORAL | Status: AC

## 2011-07-29 NOTE — Assessment & Plan Note (Signed)
Pt's sxs and PE consistent w/ infxn.  Start abx.  Reviewed supportive care and red flags that should prompt return.  Pt expressed understanding and is in agreement w/ plan.  

## 2011-07-29 NOTE — Progress Notes (Signed)
  Subjective:    Patient ID: Erin [name], female    DOB: 28-Dec-1980, 30 y.o.   MRN: [id number]  HPI ? Sinus infxn- sxs started Tuesday w/ PND, HA.  + nasal congestion.  Denies facial pain/pressure.  No ear pain, fevers.  + sick contacts.  + dry cough- predominately afternoon.   Review of Systems For ROS see HPI     Objective:   Physical Exam  Vitals reviewed. Constitutional: She appears well-developed and well-nourished. No distress.  HENT:  Head: Normocephalic and atraumatic.  Right Ear: Tympanic membrane normal.  Left Ear: Tympanic membrane normal.  Nose: Mucosal edema and rhinorrhea present. Right sinus exhibits maxillary sinus tenderness and frontal sinus tenderness. Left sinus exhibits maxillary sinus tenderness and frontal sinus tenderness.  Mouth/Throat: Uvula is midline and mucous membranes are normal. Posterior oropharyngeal erythema present. No oropharyngeal exudate.  Eyes: Conjunctivae and EOM are normal. Pupils are equal, round, and reactive to light.  Neck: Normal range of motion. Neck supple.  Cardiovascular: Normal rate, regular rhythm and normal heart sounds.   Pulmonary/Chest: Effort normal and breath sounds normal. No respiratory distress. She has no wheezes.  Lymphadenopathy:    She has no cervical adenopathy.          Assessment & Plan:

## 2011-07-29 NOTE — Patient Instructions (Signed)
Start the Zpack for your early sinus infection/bronchitis Drink plenty of fluids REST! Add Mucinex to thin your congestion Robitussin or Delsym as needed for cough Hang in there!! Happy Holidays!!!

## 2011-12-26 ENCOUNTER — Other Ambulatory Visit: Payer: Self-pay | Admitting: *Deleted

## 2011-12-26 MED ORDER — CETIRIZINE HCL 10 MG PO TABS: 10.0000 mg | ORAL_TABLET | Freq: Every day | ORAL | Status: DC

## 2011-12-26 NOTE — Telephone Encounter (Signed)
Rx sent 

## 2012-01-19 ENCOUNTER — Other Ambulatory Visit: Payer: Self-pay | Admitting: Family Medicine

## 2012-01-23 ENCOUNTER — Encounter: Payer: Self-pay | Admitting: Family Medicine

## 2012-01-23 ENCOUNTER — Ambulatory Visit (INDEPENDENT_AMBULATORY_CARE_PROVIDER_SITE_OTHER): Payer: BC Managed Care – PPO | Admitting: Family Medicine

## 2012-01-23 VITALS — BP 122/84 | HR 90 | Temp 98.4°F | Wt 216.4 lb

## 2012-01-23 DIAGNOSIS — J329 Chronic sinusitis, unspecified: Secondary | ICD-10-CM

## 2012-01-23 DIAGNOSIS — E039 Hypothyroidism, unspecified: Secondary | ICD-10-CM

## 2012-01-23 LAB — T3, FREE: T3, Free: 2.6 pg/mL (ref 2.3–4.2)

## 2012-01-23 LAB — TSH: TSH: 1.33 u[IU]/mL (ref 0.35–5.50)

## 2012-01-23 MED ORDER — CEFUROXIME AXETIL 500 MG PO TABS: 500.0000 mg | ORAL_TABLET | Freq: Two times a day (BID) | ORAL | Status: AC

## 2012-01-23 MED ORDER — MOMETASONE FUROATE 50 MCG/ACT NA SUSP: 2.0000 | Freq: Every day | NASAL | Status: DC

## 2012-01-23 NOTE — Patient Instructions (Signed)

## 2012-01-23 NOTE — Progress Notes (Signed)
  Subjective:     Erin [name] is a 31 y.o. female who presents for evaluation of sinus pain. Symptoms include: congestion, cough, headaches and sinus pressure. Onset of symptoms was 3 weeks ago. Symptoms have been gradually worsening since that time. Past history is significant for no history of pneumonia or bronchitis. Patient is a non-smoker.  The following portions of the patient's history were reviewed and updated as appropriate: allergies, current medications, past family history, past medical history, past social history, past surgical history and problem list.  Review of Systems Pertinent items are noted in HPI.   Objective:    BP 122/84  Pulse 90  Temp(Src) 98.4 F (36.9 C) (Oral)  Wt 216 lb 6.4 oz (98.158 kg)  SpO2 95% General appearance: alert, cooperative, appears stated age and no distress Head: Normocephalic, without obvious abnormality, atraumatic Nose: green discharge, moderate congestion, turbinates red, swollen, sinus tenderness bilateral Throat: lips, mucosa, and tongue normal; teeth and gums normal Neck: no adenopathy, supple, symmetrical, trachea midline and thyroid not enlarged, symmetric, no tenderness/mass/nodules Lungs: clear to auscultation bilaterally Heart: regular rate and rhythm, S1, S2 normal, no murmur, click, rub or gallop Extremities: extremities normal, atraumatic, no cyanosis or edema    Assessment:    Acute bacterial sinusitis.   hypothyroidism--- check labs,  No complaints Plan:    Nasal steroids per medication orders. Antihistamines per medication orders. Ceftin per medication orders.

## 2012-02-28 ENCOUNTER — Other Ambulatory Visit: Payer: Self-pay | Admitting: Family Medicine

## 2012-05-30 ENCOUNTER — Telehealth: Payer: Self-pay | Admitting: Internal Medicine

## 2012-05-30 NOTE — Telephone Encounter (Signed)
PT seen Dr Maple Hudson last in 03/2011.  Dr Maple Hudson recommended allergy testing at that time but pt could not afford copays at that time.  Pt is now having sinus ha's and drainage and wants to know what to do from here.  Does she need regular appt with Dr Maple Hudson or appt for allergy testing.  Please advise.

## 2012-05-30 NOTE — Telephone Encounter (Signed)
Recommend fexofenadine-D at pharmacy counter( 12 hour form), to take twice daily if needed. Also recommend saline nasal rinse, like with a Netipot. Pharmacy can help with that.

## 2012-05-30 NOTE — Telephone Encounter (Signed)
ATC - Unable to leave voice mail.  Will try back later.

## 2012-05-31 NOTE — Telephone Encounter (Signed)
ATC, NA and unable to leave msg b/c mailbox not set up yet, [facility]

## 2012-06-01 NOTE — Telephone Encounter (Signed)
ATC, NA and unable to leave msg bc mailbox is not set up.  WCB

## 2012-06-04 NOTE — Telephone Encounter (Signed)
ATC NA, still unable to leave msg, [facility]

## 2012-06-04 NOTE — Telephone Encounter (Signed)
Pt advised of recs for medications. The other part to the question was should the pt scheudle a follow-up appt or can we schedule her for allergy testing. Pt last seen on 04/01/11 and was advised to f/u with allergy testing but appt was never made. Please advise.Carron Curie, CMA

## 2012-06-05 NOTE — Telephone Encounter (Signed)
ATC, NA, voicemail not setup so unable to leave a message. WCB. Carron Curie, CMA

## 2012-06-05 NOTE — Telephone Encounter (Signed)
Per CY-okay to routine OV only.

## 2012-06-06 NOTE — Telephone Encounter (Signed)
ATC-voicemail not set up and no other numbers to attempt to reach patient.

## 2012-06-07 NOTE — Telephone Encounter (Signed)
Unable to reach the patient. Msg stated mailbox has not been set up yet.

## 2012-06-08 NOTE — Telephone Encounter (Signed)
ATC # provided above - received msg that mailbox has not been set up yet.  WCB

## 2012-06-11 NOTE — Telephone Encounter (Signed)
ATC pt at number provided above - mailbox has not been set up yet.  Per protocol, will sign off on msg.

## 2012-06-27 ENCOUNTER — Telehealth: Payer: Self-pay | Admitting: Internal Medicine

## 2012-06-27 MED ORDER — PREDNISONE 20 MG PO TABS: ORAL_TABLET | ORAL | Status: DC

## 2012-06-27 NOTE — Telephone Encounter (Signed)
Spoke with patient in regards to recs per CDY. Pt aware to take Pred 20mg  x 4 days. Sent to [facility] on [address] [place]. Per CY, patient needs to be put on his schedule in next few days//next available to be seen. Message routed to [facility] per CY.

## 2012-06-27 NOTE — Telephone Encounter (Signed)
ATC patient to see if she can come in for Acute visit on Friday 06-29-2012 a t11:30am with CY. Unable to reach patient and unable to leave message as patient has not set up voicemail at this time.

## 2012-06-27 NOTE — Telephone Encounter (Signed)
PT c/o worsening allergy/ sinus symptoms for past 2 weeks.  C/o sinus drainage (clear), PND, sinus and ear pressure and sneezing. Pt taking otc benadryl because she said this works better than Zyrtec.  Also using nasal saline spray.  Please advise.   Last ov: 04-01-11   Next ov:  Allergy testing 08-15-12 Allergies  Allergen Reactions  . Sulfonamide Derivatives

## 2012-06-28 NOTE — Telephone Encounter (Signed)
Pt can be added to schedule for Tuesday 07-03-12 at 11:15am.

## 2012-06-28 NOTE — Telephone Encounter (Signed)
Pt aware if appt date and time. Nothing further was needed

## 2012-07-03 ENCOUNTER — Encounter: Payer: Self-pay | Admitting: Internal Medicine

## 2012-07-03 ENCOUNTER — Ambulatory Visit (INDEPENDENT_AMBULATORY_CARE_PROVIDER_SITE_OTHER): Payer: BC Managed Care – PPO | Admitting: Internal Medicine

## 2012-07-03 VITALS — BP 130/84 | HR 100 | Ht 68.0 in | Wt 220.0 lb

## 2012-07-03 DIAGNOSIS — J4520 Mild intermittent asthma, uncomplicated: Secondary | ICD-10-CM

## 2012-07-03 DIAGNOSIS — J45909 Unspecified asthma, uncomplicated: Secondary | ICD-10-CM

## 2012-07-03 DIAGNOSIS — Z23 Encounter for immunization: Secondary | ICD-10-CM

## 2012-07-03 DIAGNOSIS — J302 Other seasonal allergic rhinitis: Secondary | ICD-10-CM

## 2012-07-03 DIAGNOSIS — J309 Allergic rhinitis, unspecified: Secondary | ICD-10-CM

## 2012-07-03 NOTE — Progress Notes (Signed)
Subjective:    Patient ID: Erin [name], female    DOB: December 08, 1980, 31 y.o.   MRN: [id number]  HPI 04/01/11- 55 yoF never smoker, seen at kind request of Dr Laury Axon for allergy evaluation.  She had been followed at [facility] by Dr Corinda Gubler, where skin tests showed 4+ reactions to grasses, dust/ mites, and also oyster, shrimp, clam, crab, trees and weeds. She had done well on allergy vaccine 3 years PTA, dropping off 1.5 years ago, without reactions to the shots.  She has changed jobs and wishes to change care here because we are closer.  She describes childhood allergic rhinitis and exertional asthma. Got worse as an adult. No recent asthma and has never needed ER for asthma.  Complains of rhinorhea, nasal congestion, waking with retroorbital pressure, frontal headaches. Neti pot helps. Nasal sprays help if used. Using cetirizine. Recent thyroidectomy, otherwise no ENT surgery.  Triggers- seafood restaurants- shell fish, shrimp, oysters make throat swell.  Spring pollens, rain/ humid weather. She questions her foam pillow. 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  10//22/13- 31 yoF never smoker followed for allergic rhinitis/ Food allergy She never came back for allergy testing after initial visit.  Pt c/o daily drainage, eye pressure/reddness, sinus HA and photophobia. Pt denies any color change of mucus. Pt interested in flu vax. We sent  prednisone when she called last week, not certain that helped any. Feels a little crackle in her chest but no wheezing. Not using Flonase or Nasonex continues Zyrtec.  ROS-see HPI Constitutional:   No-   weight loss, night sweats, fevers, chills, fatigue, lassitude. HEENT:   No-  headaches, difficulty swallowing, tooth/dental problems, sore throat,      + sneezing, itching, ear ache, nasal congestion, post nasal drip,  CV:  No-   chest pain, orthopnea, PND, swelling in lower extremities,  anasarca,  dizziness, palpitations Resp: No-   shortness of breath with exertion or at rest.              No-   productive cough,  No non-productive cough,  No- coughing up of blood.              No-   change in color of mucus.  No- wheezing.   Skin: No-   rash or lesions. GI:  No-   heartburn, indigestion, abdominal pain, nausea, vomiting,  GU:  MS:  No-   joint pain or swelling.   Neuro-     nothing unusual Psych:  No- change in mood or affect. No depression or anxiety.  No memory loss.  Objective:   Physical Exam General- Alert, Oriented, Affect-appropriate, Distress- none acute    overweight Skin- rash-none, lesions- none, excoriation- none Lymphadenopathy- none Head- atraumatic            Eyes- Gross vision intact, PERRLA, conjunctivae clear secretions   proptosis            Ears- Hearing, canals            Nose- Turbinate edema, No- Septal dev, mucus, polyps, erosion, perforation             Throat- Mallampati III , mucosa clear , drainage- none, tonsils- atrophic. Thyroid incision scar.               Neck- flexible , trachea midline, no stridor , thyroid nl, carotid no bruit Chest - symmetrical excursion , unlabored  Heart/CV- RRR , no murmur , no gallop  , no rub, nl s1 s2                           - JVD- none , edema- none, stasis changes- none, varices- none           Lung- clear to P&A, wheeze- none, cough- none , dullness-none, rub- none           Chest wall-  Abd-  Br/ Gen/ Rectal- Not done, not indicated Extrem- cyanosis- none, clubbing, none, atrophy- none, strength- nl Neuro- grossly intact to observation Assessment & Plan:

## 2012-07-03 NOTE — Patient Instructions (Addendum)
Flu vax  Sample Dymista nasal spray  1-2 puffs each nostril once every day at bedtime  Try otc decongestant like Sudafed-PE as needed for nasal congestion

## 2012-07-13 NOTE — Assessment & Plan Note (Signed)
Seasonal exacerbation. Plan- Sample Dymista nasal spray. Sudafed-PE, flu vaccine

## 2012-07-13 NOTE — Assessment & Plan Note (Signed)
Good control now with no changes needed.

## 2012-08-15 ENCOUNTER — Ambulatory Visit: Payer: Self-pay | Admitting: Internal Medicine

## 2012-09-27 ENCOUNTER — Telehealth: Payer: Self-pay | Admitting: Internal Medicine

## 2012-09-27 MED ORDER — AZELASTINE-FLUTICASONE 137-50 MCG/ACT NA SUSP: 2.0000 | Freq: Every day | NASAL | Status: DC

## 2012-09-27 NOTE — Telephone Encounter (Signed)
Refill sent and pt is aware. Jennifer Castillo, CMA  

## 2012-09-27 NOTE — Telephone Encounter (Signed)
Pt uses [facility] on [address].Erin [name]

## 2012-10-12 ENCOUNTER — Telehealth: Payer: Self-pay | Admitting: Internal Medicine

## 2012-10-12 NOTE — Telephone Encounter (Signed)
Called [facility] for [place] at [phone]. Member # [id number] Dymista APPROVED from 10/12/12 through 10/12/13 Case ID #[id number] Pharmacy and pt notified.,

## 2012-12-31 ENCOUNTER — Ambulatory Visit: Payer: BC Managed Care – PPO | Admitting: Internal Medicine

## 2013-03-07 ENCOUNTER — Other Ambulatory Visit: Payer: Self-pay | Admitting: Family Medicine

## 2013-03-20 ENCOUNTER — Other Ambulatory Visit: Payer: BC Managed Care – PPO

## 2013-03-28 ENCOUNTER — Encounter: Payer: Self-pay | Admitting: Family Medicine

## 2013-03-28 ENCOUNTER — Ambulatory Visit (INDEPENDENT_AMBULATORY_CARE_PROVIDER_SITE_OTHER): Payer: BC Managed Care – PPO | Admitting: Family Medicine

## 2013-03-28 VITALS — BP 118/84 | HR 72 | Temp 98.2°F | Wt 229.6 lb

## 2013-03-28 DIAGNOSIS — J309 Allergic rhinitis, unspecified: Secondary | ICD-10-CM

## 2013-03-28 DIAGNOSIS — R51 Headache: Secondary | ICD-10-CM

## 2013-03-28 DIAGNOSIS — J4520 Mild intermittent asthma, uncomplicated: Secondary | ICD-10-CM

## 2013-03-28 DIAGNOSIS — J45909 Unspecified asthma, uncomplicated: Secondary | ICD-10-CM

## 2013-03-28 DIAGNOSIS — E039 Hypothyroidism, unspecified: Secondary | ICD-10-CM

## 2013-03-28 DIAGNOSIS — E890 Postprocedural hypothyroidism: Secondary | ICD-10-CM

## 2013-03-28 DIAGNOSIS — J3089 Other allergic rhinitis: Secondary | ICD-10-CM

## 2013-03-28 DIAGNOSIS — J302 Other seasonal allergic rhinitis: Secondary | ICD-10-CM

## 2013-03-28 LAB — CBC WITH DIFFERENTIAL/PLATELET
Basophils Absolute: 0 10*3/uL (ref 0.0–0.1)
Basophils Relative: 0.5 % (ref 0.0–3.0)
Eosinophils Absolute: 0.1 10*3/uL (ref 0.0–0.7)
Eosinophils Relative: 1.8 % (ref 0.0–5.0)
HCT: 41.1 % (ref 36.0–46.0)
Hemoglobin: 13.8 g/dL (ref 12.0–15.0)
Lymphocytes Relative: 21.5 % (ref 12.0–46.0)
Lymphs Abs: 1.7 10*3/uL (ref 0.7–4.0)
MCHC: 33.5 g/dL (ref 30.0–36.0)
MCV: 87.6 fl (ref 78.0–100.0)
Monocytes Absolute: 0.5 10*3/uL (ref 0.1–1.0)
Monocytes Relative: 6.3 % (ref 3.0–12.0)
Neutro Abs: 5.7 10*3/uL (ref 1.4–7.7)
Neutrophils Relative %: 69.9 % (ref 43.0–77.0)
Platelets: 143 10*3/uL — ABNORMAL LOW (ref 150.0–400.0)
RBC: 4.69 Mil/uL (ref 3.87–5.11)
RDW: 14.1 % (ref 11.5–14.6)
WBC: 9.8 10*3/uL (ref 4.5–10.5)

## 2013-03-28 LAB — HEPATIC FUNCTION PANEL
ALT: 10 U/L (ref 0–35)
AST: 13 U/L (ref 0–37)
Albumin: 4.3 g/dL (ref 3.5–5.2)
Alkaline Phosphatase: 46 U/L (ref 39–117)
Bilirubin, Direct: 0 mg/dL (ref 0.0–0.3)
Total Bilirubin: 0.5 mg/dL (ref 0.3–1.2)
Total Protein: 7.5 g/dL (ref 6.0–8.3)

## 2013-03-28 LAB — BASIC METABOLIC PANEL
BUN: 14 mg/dL (ref 6–23)
Creatinine, Ser: 1 mg/dL (ref 0.4–1.2)
GFR: 85.67 mL/min (ref >60.00)
Potassium: 3.9 mEq/L (ref 3.5–5.1)

## 2013-03-28 MED ORDER — AZELASTINE-FLUTICASONE 137-50 MCG/ACT NA SUSP: 2.0000 | Freq: Every day | NASAL | Status: DC

## 2013-03-28 MED ORDER — NONFORMULARY OR COMPOUNDED ITEM: Status: DC

## 2013-03-28 NOTE — Patient Instructions (Addendum)

## 2013-03-28 NOTE — Assessment & Plan Note (Signed)
Per Dr Maple Hudson Pt really like dymista--- rx sent to pharmacy and pt given coupon

## 2013-03-28 NOTE — Assessment & Plan Note (Signed)
Check labs con't meds 

## 2013-03-30 DIAGNOSIS — R519 Headache, unspecified: Secondary | ICD-10-CM | POA: Insufficient documentation

## 2013-03-30 DIAGNOSIS — R51 Headache: Secondary | ICD-10-CM | POA: Insufficient documentation

## 2013-03-30 NOTE — Assessment & Plan Note (Signed)
Massage Stretches shown to pt

## 2013-03-30 NOTE — Assessment & Plan Note (Signed)
con't inhalers  

## 2013-03-30 NOTE — Progress Notes (Signed)
  Subjective:    Patient ID: Erin [name], female    DOB: 30-Mar-1981, 32 y.o.   MRN: [id number]  HPI pt here f/u thyroid and have labs done.  Pt c/o no thyroid symptoms She is having more stress headaches and inc allergy symptoms.  Pt has seen allergist and would like rx dymista.  No other complaint.s    Review of Systems As above    Objective:   Physical Exam BP 118/84  Pulse 72  Temp(Src) 98.2 F (36.8 C) (Oral)  Wt 229 lb 9.6 oz (104.146 kg)  BMI 34.92 kg/m2  SpO2 96% General appearance: alert, cooperative, appears stated age and no distress Ears: normal TM's and external ear canals both ears Nose: Nares normal. Septum midline. Mucosa normal. No drainage or sinus tenderness. Throat: lips, mucosa, and tongue normal; teeth and gums normal Neck: no adenopathy, no carotid bruit, no JVD, supple, symmetrical, trachea midline and thyroid not enlarged, symmetric, no tenderness/mass/nodules Lungs: clear to auscultation bilaterally Heart: S1, S2 normal        Assessment & Plan:

## 2013-04-20 ENCOUNTER — Other Ambulatory Visit: Payer: Self-pay | Admitting: Family Medicine

## 2013-06-18 ENCOUNTER — Ambulatory Visit (INDEPENDENT_AMBULATORY_CARE_PROVIDER_SITE_OTHER): Payer: BC Managed Care – PPO | Admitting: Physician Assistant

## 2013-06-18 VITALS — BP 110/76 | HR 90 | Temp 98.2°F | Resp 18 | Ht 67.5 in | Wt 223.6 lb

## 2013-06-18 DIAGNOSIS — G4700 Insomnia, unspecified: Secondary | ICD-10-CM

## 2013-06-18 DIAGNOSIS — N760 Acute vaginitis: Secondary | ICD-10-CM

## 2013-06-18 DIAGNOSIS — Z124 Encounter for screening for malignant neoplasm of cervix: Secondary | ICD-10-CM

## 2013-06-18 DIAGNOSIS — Z23 Encounter for immunization: Secondary | ICD-10-CM

## 2013-06-18 DIAGNOSIS — Z0000 Encounter for general adult medical examination without abnormal findings: Secondary | ICD-10-CM

## 2013-06-18 DIAGNOSIS — A499 Bacterial infection, unspecified: Secondary | ICD-10-CM

## 2013-06-18 DIAGNOSIS — R5383 Other fatigue: Secondary | ICD-10-CM

## 2013-06-18 DIAGNOSIS — F430 Acute stress reaction: Secondary | ICD-10-CM

## 2013-06-18 DIAGNOSIS — R5381 Other malaise: Secondary | ICD-10-CM

## 2013-06-18 DIAGNOSIS — B9689 Other specified bacterial agents as the cause of diseases classified elsewhere: Secondary | ICD-10-CM

## 2013-06-18 DIAGNOSIS — N898 Other specified noninflammatory disorders of vagina: Secondary | ICD-10-CM

## 2013-06-18 DIAGNOSIS — N926 Irregular menstruation, unspecified: Secondary | ICD-10-CM

## 2013-06-18 LAB — POCT UA - MICROSCOPIC ONLY
Casts, Ur, LPF, POC: NEGATIVE
Mucus, UA: POSITIVE

## 2013-06-18 LAB — POCT CBC
Granulocyte percent: 69.5 %G (ref 37–80)
HCT, POC: 40 % (ref 37.7–47.9)
MPV: 9.2 fL (ref 0–99.8)
POC Granulocyte: 7.1 — AB (ref 2–6.9)
POC LYMPH PERCENT: 25.2 %L (ref 10–50)
POC MID %: 5.3 %M (ref 0–12)
Platelet Count, POC: 263 10*3/uL (ref 142–424)
RDW, POC: 14.2 %

## 2013-06-18 LAB — POCT URINALYSIS DIPSTICK
Bilirubin, UA: NEGATIVE
Ketones, UA: NEGATIVE
Leukocytes, UA: NEGATIVE
pH, UA: 7

## 2013-06-18 LAB — POCT WET PREP WITH KOH
Clue Cells Wet Prep HPF POC: 100
KOH Prep POC: NEGATIVE
Trichomonas, UA: NEGATIVE

## 2013-06-18 MED ORDER — SERTRALINE HCL 50 MG PO TABS: 50.0000 mg | ORAL_TABLET | Freq: Every day | ORAL | Status: DC

## 2013-06-18 MED ORDER — METRONIDAZOLE 500 MG PO TABS: 500.0000 mg | ORAL_TABLET | Freq: Two times a day (BID) | ORAL | Status: DC

## 2013-06-18 MED ORDER — ALPRAZOLAM 0.25 MG PO TABS: 0.2500 mg | ORAL_TABLET | Freq: Two times a day (BID) | ORAL | Status: DC | PRN

## 2013-06-18 NOTE — Progress Notes (Deleted)
Subjective:    Patient ID: Erin [name], female    DOB: 1981/07/24, 32 y.o.   MRN: [id number]  HPI  This 32 y.o. female presents for evaluation of ***.    Active Ambulatory Problems    Diagnosis Date Noted  . GOITER 03/07/2008  . POSTSURGICAL HYPOTHYROIDISM 05/28/2009  . ANXIETY STATE, UNSPECIFIED 05/28/2009  . Asthma, mild intermittent, well-controlled 06/23/2008  . ELEVATED BLOOD PRESSURE WITHOUT DIAGNOSIS OF HYPERTENSION 01/21/2008  . Perennial allergic rhinitis with seasonal variation 04/01/2011  . 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 Headache(784.0) 03/30/2013   Resolved Ambulatory Problems    Diagnosis Date Noted  . Sinusitis 07/29/2011   Past Medical History  Diagnosis Date  . Asthma   . Hypertension   . Abortion history   . Elevated blood pressure     Past Surgical History  Procedure Laterality Date  . Induced abortion      x3  . Breast surgery      Breast reduction  . Thyroidectomy  10/30/08    [facility]    Allergies  Allergen Reactions  . Sulfonamide Derivatives     Prior to Admission medications   Medication Sig Start Date End Date Taking? Authorizing Provider  cetirizine (ZYRTEC) 10 MG tablet Take 1 tablet (10 mg total) by mouth daily. 12/26/11  Yes Lelon Perla, DO  Cyanocobalamin (VITAMIN B-12) 2500 MCG SUBL Place 1 tablet under the tongue daily.     Yes Historical Provider, MD  Multiple Vitamin (MULTIVITAMIN) tablet Take 1 tablet by mouth daily.     Yes Historical Provider, MD  NONFORMULARY OR COMPOUNDED ITEM Massages  Every other week  Dx cervical strain/sprain, stress 03/28/13  Yes Lelon Perla, DO  SYNTHROID 125 MCG tablet take 1 tablet by mouth once daily 04/20/13  Yes Yvonne R Lowne, DO  albuterol (VENTOLIN HFA) 108 (90 BASE) MCG/ACT inhaler Inhale 2 puffs into the lungs every 6 (six) hours as needed.      Historical Provider, MD  Azelastine-Fluticasone 137-50 MCG/ACT SUSP Place 2 sprays into the nose at bedtime. 03/28/13   Lelon Perla, DO    History   Social History  . Marital Status: Single    Spouse Name: n/a    Number of Children: 0  . Years of Education: Master's   Occupational History  . 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 Smoking status: Never Smoker   . Smokeless tobacco: Never Used  . Alcohol Use: 1.5 oz/week    3 drink(s) per week  . Drug Use: No  . Sexual Activity: Yes    Partners: Male    Birth Control/ Protection: Condom   Other Topics Concern  . None   Social History Narrative   Working on her doctoral degree.  Lives alone.   History  Sexual Activity  . Sexual Activity: Yes  . Partners: Male  . Birth Control/ Protection: Condom    family history includes Coronary artery disease in her father; Diabetes in her maternal aunt; Hypertension in her mother; Lung cancer in an other family member; Stroke in her father. indicated that her mother is alive. She indicated that her father is deceased. She indicated that her maternal grandmother is alive. She indicated that her maternal grandfather is deceased. She indicated that her paternal grandmother is deceased. She indicated that her paternal grandfather is deceased.    Review of Systems     Objective:   Physical Exam  Assessment & Plan:

## 2013-06-18 NOTE — Progress Notes (Signed)
Subjective:    Patient ID: Erin [name], female    DOB: 30-Apr-1981, 32 y.o.   MRN: [id number]  HPI This 32 y.o. female presents for Annual Wellness Exam.  Additionally, she reports vaginal discharge and odor and poor sleep, fatigue, decreased concentration, irregular menses.  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 She requests a vitamin B12 injection, stating that she's been getting them at the bariatric clinic for $15 whenever she needs an energy boost.  To her knowledge, she's never been diagnosed with anemia or vitamin B12 deficiency.   Active Ambulatory Problems    Diagnosis Date Noted  . GOITER 03/07/2008  . POSTSURGICAL HYPOTHYROIDISM 05/28/2009  . ANXIETY STATE, UNSPECIFIED 05/28/2009  . Asthma, mild intermittent, well-controlled 06/23/2008  . ELEVATED BLOOD PRESSURE WITHOUT DIAGNOSIS OF HYPERTENSION 01/21/2008  . Perennial allergic rhinitis with seasonal variation 04/01/2011  . 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 Headache(784.0) 03/30/2013   Resolved Ambulatory Problems    Diagnosis Date Noted  . Sinusitis 07/29/2011   Past Medical History  Diagnosis Date  . Asthma   . Hypertension   . Abortion history   . Elevated blood pressure     Past Surgical History  Procedure Laterality Date  . Induced abortion      x3  . Breast surgery      Breast reduction  . Thyroidectomy  10/30/08    [facility]    Allergies  Allergen Reactions  . Sulfonamide Derivatives     Prior to Admission medications   Medication Sig Start Date End Date Taking? Authorizing Provider  cetirizine (ZYRTEC) 10 MG tablet Take 1 tablet (10 mg total) by mouth daily. 12/26/11  Yes Lelon Perla, DO  Cyanocobalamin (VITAMIN B-12) 2500 MCG SUBL Place 1 tablet under the tongue daily.     Yes Historical Provider, MD  Multiple Vitamin (MULTIVITAMIN) tablet Take 1 tablet by mouth daily.     Yes Historical Provider, MD  NONFORMULARY OR COMPOUNDED ITEM  Massages  Every other week  Dx cervical strain/sprain, stress 03/28/13  Yes Lelon Perla, DO  SYNTHROID 125 MCG tablet take 1 tablet by mouth once daily 04/20/13  Yes Yvonne R Lowne, DO  albuterol (VENTOLIN HFA) 108 (90 BASE) MCG/ACT inhaler Inhale 2 puffs into the lungs every 6 (six) hours as needed.      Historical Provider, MD  ALPRAZolam Prudy Feeler) 0.25 MG tablet Take 1 tablet (0.25 mg total) by mouth 2 (two) times daily as needed for sleep or anxiety. 06/18/13   Ida Milbrath Tessa Lerner, PA-C  Azelastine-Fluticasone 137-50 MCG/ACT SUSP Place 2 sprays into the nose at bedtime. 03/28/13   Lelon Perla, DO  metroNIDAZOLE (FLAGYL) 500 MG tablet Take 1 tablet (500 mg total) by mouth 2 (two) times daily with a meal. DO NOT CONSUME ALCOHOL WHILE TAKING THIS MEDICATION. 06/18/13   Emarie Paul S Maela Takeda, PA-C  sertraline (ZOLOFT) 50 MG tablet Take 1 tablet (50 mg total) by mouth daily. 06/18/13   Fernande Bras, PA-C    History   Social History  . Marital Status: Single    Spouse Name: n/a    Number of Children: 0  . Years of Education: Master's   Occupational History  . 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 Smoking status: Never Smoker   . Smokeless tobacco: Never Used  . Alcohol Use: 1.5 oz/week    3 drink(s) per week  . Drug Use:  No  . Sexual Activity: Yes    Partners: Male    Birth Control/ Protection: Condom   Other Topics Concern  . None   Social History Narrative   Working on her doctoral degree.  Lives alone.   History  Sexual Activity  . Sexual Activity: Yes  . Partners: Male; 4 in the last 12 months  . Birth Control/ Protection: Condom   OB History   Grav Para Term Preterm Abortions TAB SAB Ect Mult Living   3 0 0 0 3 3 0 0 0 0      family history includes Coronary artery disease in her father; Diabetes in her maternal aunt; Hypertension in her mother; Lung cancer in an other family member; Stroke in her father. indicated that her mother is alive. She  indicated that her father is deceased. She indicated that her maternal grandmother is alive. She indicated that her maternal grandfather is deceased. She indicated that her paternal grandmother is deceased. She indicated that her paternal grandfather is deceased.     Review of Systems  Constitutional: Positive for fatigue and unexpected weight change (weight loss/gain). Negative for fever, chills, diaphoresis, activity change and appetite change.  HENT: Positive for congestion, postnasal drip, rhinorrhea and sinus pressure. Negative for dental problem, drooling, ear discharge, ear pain, facial swelling, hearing loss, mouth sores, nosebleeds, trouble swallowing and voice change.   Respiratory: Positive for cough and wheezing. Negative for apnea, choking, chest tightness, shortness of breath and stridor.        These symptoms are intermittent and she treats them PRN.  She notes improvement with weight loss.  Cardiovascular: Negative.   Gastrointestinal: Negative.   Endocrine: Positive for cold intolerance and heat intolerance. Negative for polydipsia, polyphagia and polyuria.       Thyroid is managed by PCP.  Genitourinary: Positive for vaginal discharge and menstrual problem (irregular menses with increased stress). Negative for dysuria, urgency, frequency, hematuria, flank pain, decreased urine volume, vaginal bleeding, enuresis, difficulty urinating, genital sores, vaginal pain, pelvic pain and dyspareunia.  Musculoskeletal: Negative.   Skin: Negative.   Allergic/Immunologic: Negative.   Neurological: Negative.   Hematological: Negative.   Psychiatric/Behavioral: Positive for sleep disturbance, dysphoric mood and decreased concentration. Negative for suicidal ideas, hallucinations, behavioral problems, confusion, self-injury and agitation. The patient is nervous/anxious. The patient is not hyperactive.        Objective:   Physical Exam  Vitals reviewed. Constitutional: She is oriented to  person, place, and time. Vital signs are normal. She appears well-developed and well-nourished. She is active and cooperative. No distress.  HENT:  Head: Normocephalic and atraumatic.  Right Ear: Hearing, tympanic membrane, external ear and ear canal normal. No foreign bodies.  Left Ear: Hearing, tympanic membrane, external ear and ear canal normal. No foreign bodies.  Nose: Nose normal.  Mouth/Throat: Uvula is midline, oropharynx is clear and moist and mucous membranes are normal. No oral lesions. Normal dentition. No dental abscesses or uvula swelling. No oropharyngeal exudate.  Eyes: Conjunctivae, EOM and lids are normal. Pupils are equal, round, and reactive to light. Right eye exhibits no discharge. Left eye exhibits no discharge. No scleral icterus.  Fundoscopic exam:      The right eye shows no arteriolar narrowing, no AV nicking, no exudate, no hemorrhage and no papilledema.       The left eye shows no arteriolar narrowing, no AV nicking, no exudate, no hemorrhage and no papilledema.  Neck: Trachea normal, normal range of motion and full passive range  of motion without pain. Neck supple. No spinous process tenderness and no muscular tenderness present. No mass and no thyromegaly present.  Cardiovascular: Normal rate, regular rhythm, normal heart sounds, intact distal pulses and normal pulses.   Pulmonary/Chest: Effort normal and breath sounds normal. She exhibits no tenderness and no retraction. Right breast exhibits no inverted nipple, no mass, no nipple discharge, no skin change and no tenderness. Left breast exhibits no inverted nipple, no mass, no nipple discharge, no skin change and no tenderness. Breasts are symmetrical.  Abdominal: Soft. Normal appearance and bowel sounds are normal. She exhibits no distension and no mass. There is no hepatosplenomegaly. There is no tenderness. There is no rigidity, no rebound, no guarding, no CVA tenderness, no tenderness at McBurney's point and  negative Murphy's sign. No hernia. Hernia confirmed negative in the right inguinal area and confirmed negative in the left inguinal area.  Genitourinary: Rectum normal, vagina normal and uterus normal. Rectal exam shows no external hemorrhoid and no fissure. No breast swelling, tenderness, discharge or bleeding. Pelvic exam was performed with patient supine. No labial fusion. There is no rash, tenderness, lesion or injury on the right labia. There is no rash, tenderness, lesion or injury on the left labia. Cervix exhibits no motion tenderness, no discharge and no friability. Right adnexum displays no mass, no tenderness and no fullness. Left adnexum displays no mass, no tenderness and no fullness. No erythema, tenderness or bleeding around the vagina. No foreign body around the vagina. No signs of injury around the vagina. No vaginal discharge found.  Musculoskeletal: She exhibits no edema and no tenderness.       Cervical back: Normal.       Thoracic back: Normal.       Lumbar back: Normal.  Lymphadenopathy:       Head (right side): No tonsillar, no preauricular, no posterior auricular and no occipital adenopathy present.       Head (left side): No tonsillar, no preauricular, no posterior auricular and no occipital adenopathy present.    She has no cervical adenopathy.    She has no axillary adenopathy.       Right: No inguinal and no supraclavicular adenopathy present.       Left: No inguinal and no supraclavicular adenopathy present.  Neurological: She is alert and oriented to person, place, and time. She has normal strength and normal reflexes. No cranial nerve deficit. She exhibits normal muscle tone. Coordination and gait normal.  Skin: Skin is warm, dry and intact. No rash noted. She is not diaphoretic. No cyanosis or erythema. Nails show no clubbing.  Psychiatric: She has a normal mood and affect. Her speech is normal and behavior is normal. Judgment and thought content normal.       Results for orders placed in visit on 06/18/13  POCT URINALYSIS DIPSTICK      Result Value Range   Color, UA YELLOW     Clarity, UA CLEAR     Glucose, UA NEG     Bilirubin, UA NEG     Ketones, UA NEG     Spec Grav, UA 1.025     Blood, UA TRACE     pH, UA 7.0     Protein, UA NEG     Urobilinogen, UA 0.2     Nitrite, UA NEG     Leukocytes, UA Negative    POCT UA - MICROSCOPIC ONLY      Result Value Range   WBC, Ur, HPF, POC 1-2  RBC, urine, microscopic 4-10     Bacteria, U Microscopic 2+     Mucus, UA POS     Epithelial cells, urine per micros 4-5     Crystals, Ur, HPF, POC NEG     Casts, Ur, LPF, POC NEG     Yeast, UA NEG    POCT CBC      Result Value Range   WBC 10.2  4.6 - 10.2 K/uL   Lymph, poc 2.6  0.6 - 3.4   POC LYMPH PERCENT 25.2  10 - 50 %L   MID (cbc) 0.5  0 - 0.9   POC MID % 5.3  0 - 12 %M   POC Granulocyte 7.1 (*) 2 - 6.9   Granulocyte percent 69.5  37 - 80 %G   RBC 4.43  4.04 - 5.48 M/uL   Hemoglobin 12.9  12.2 - 16.2 g/dL   HCT, POC 84.6  96.2 - 47.9 %   MCV 90.2  80 - 97 fL   MCH, POC 29.1  27 - 31.2 pg   MCHC 32.3  31.8 - 35.4 g/dL   RDW, POC 95.2     Platelet Count, POC 263  142 - 424 K/uL   MPV 9.2  0 - 99.8 fL  POCT WET PREP WITH KOH      Result Value Range   Trichomonas, UA Negative     Clue Cells Wet Prep HPF POC 100%     Epithelial Wet Prep HPF POC 4-12     Yeast Wet Prep HPF POC neg     Bacteria Wet Prep HPF POC 4+     RBC Wet Prep HPF POC 0-1     WBC Wet Prep HPF POC 5-8     KOH Prep POC Negative         Assessment & Plan:  Routine general medical examination at a health care facility - Age appropriate anticipatory guidance provided.  Discharge from the vagina - Plan: POCT urinalysis dipstick, POCT UA - Microscopic Only, POCT Wet Prep with KOH  BV (bacterial vaginosis) - Plan: metroNIDAZOLE (FLAGYL) 500 MG tablet  Reaction, situational, acute, to stress with Insomnia - Plan: sertraline (ZOLOFT) 50 MG tablet, ALPRAZolam  (XANAX) 0.25 MG tablet; reassess in 4 weeks, sooner PRN.  Menstrual irregularity - appears directly related to stress.  Not interested in hormonal contraception at this time.  Fatigue - Likely due to poor sleep due to stress reaction.  If Vitamin B12 level is low, will supplement, but given normal CBC, that is doubtful. Plan: POCT CBC, Comprehensive metabolic panel, Vitamin B12  Need for influenza vaccination - Plan: Flu Vaccine QUAD 36+ mos IM  Screening for cervical cancer - Plan: Pap IG, CT/NG NAA, and HPV (high risk); if both negative/normal, repeat both in 5 years.  Fernande Bras, PA-C Physician Assistant-Certified [facility]

## 2013-06-18 NOTE — Patient Instructions (Addendum)
I will contact you with your lab results as soon as they are available.   If you have not heard from me in 2 weeks, please contact me.  The fastest way to get your results is to register for My Chart (see the instructions on the last page of this printout).  Keeping You Healthy  Get These Tests 1. Blood Pressure- Have your blood pressure checked once a year by your health care provider.  Normal blood pressure is 120/80. 2. Weight- Have your body mass index (BMI) calculated to screen for obesity.  BMI is measure of body fat based on height and weight.  You can also calculate your own BMI at www.nhlbisupport.com/bmi/. 3. Cholesterol- Have your cholesterol checked every 5 years starting at age 20 then yearly starting at age 45. 4. Chlamydia, HIV, and other sexually transmitted diseases- Get screened every year until age 25, then within three months of each new sexual provider. 5. Pap Smear- Every 1-3 years; discuss with your health care provider. 6. Mammogram- Every year starting at age 40  Take these medicines  Calcium with Vitamin D-Your body needs 1200 mg of Calcium each day and 800-1000 IU of Vitamin D daily.  Your body can only absorb 500 mg of Calcium at a time so Calcium must be taken in 2 or 3 divided doses throughout the day.  Multivitamin with folic acid- Once daily if it is possible for you to become pregnant.  Get these Immunizations  Gardasil-Series of three doses; prevents HPV related illness such as genital warts and cervical cancer.  Menactra-Single dose; prevents meningitis.  Tetanus shot- Every 10 years.  Flu shot-Every year.  Take these steps 1. Do not smoke-Your healthcare provider can help you quit.  For tips on how to quit go to www.smokefree.gov or call 1-800 QUITNOW. 2. Be physically active- Exercise 5 days a week for at least 30 minutes.  If you are not already physically active, start slow and gradually work up to 30 minutes of moderate physical activity.   Examples of moderate activity include walking briskly, dancing, swimming, bicycling, etc. 3. Breast Cancer- A self breast exam every month is important for early detection of breast cancer.  For more information and instruction on self breast exams, ask your healthcare provider or www.womenshealth.gov/faq/breast-self-exam.cfm. 4. Eat a healthy diet- Eat a variety of healthy foods such as fruits, vegetables, whole grains, low fat milk, low fat cheeses, yogurt, lean meats, poultry and fish, beans, nuts, tofu, etc.  For more information go to www. Thenutritionsource.org 5. Drink alcohol in moderation- Limit alcohol intake to one drink or less per day. Never drink and drive. 6. Depression- Your emotional health is as important as your physical health.  If you're feeling down or losing interest in things you normally enjoy please talk to your healthcare provider about being screened for depression. 7. Dental visit- Brush and floss your teeth twice daily; visit your dentist twice a year. 8. Eye doctor- Get an eye exam at least every 2 years. 9. Helmet use- Always wear a helmet when riding a bicycle, motorcycle, rollerblading or skateboarding. 10. Safe sex- If you may be exposed to sexually transmitted infections, use a condom. 11. Seat belts- Seat belts can save your live; always wear one. 12. Smoke/Carbon Monoxide detectors- These detectors need to be installed on the appropriate level of your home. Replace batteries at least once a year. 13. Skin cancer- When out in the sun please cover up and use sunscreen 15 SPF or higher.   14. Violence- If anyone is threatening or hurting you, please tell your healthcare provider.        

## 2013-06-19 ENCOUNTER — Encounter: Payer: Self-pay | Admitting: Physician Assistant

## 2013-06-20 LAB — COMPREHENSIVE METABOLIC PANEL
ALT: 10 U/L (ref 0–35)
AST: 15 U/L (ref 0–37)
Albumin: 4.1 g/dL (ref 3.5–5.2)
Alkaline Phosphatase: 50 U/L (ref 39–117)
Glucose, Bld: 78 mg/dL (ref 70–99)
Potassium: 4.1 mEq/L (ref 3.5–5.3)
Sodium: 140 mEq/L (ref 135–145)
Total Protein: 6.8 g/dL (ref 6.0–8.3)

## 2013-06-21 LAB — PAP IG, CT-NG NAA, HPV HIGH-RISK
Chlamydia Probe Amp: NEGATIVE
GC Probe Amp: NEGATIVE

## 2013-09-24 ENCOUNTER — Telehealth: Payer: Self-pay | Admitting: Internal Medicine

## 2013-09-24 NOTE — Telephone Encounter (Signed)
Phone Message Closed in error copied below:    Erin [name] Description: 33 year old female  09/24/2013 Telephone Provider: Waymon Budgelinton D Young, MD  MRN: [id number] Department: [facility]             Reason for Call     sinus infection                Call Documentation     [name] at 09/24/2013 12:59 PM     Status: Signed        Pt had mother call in to check on an appt. [name]         [place]indy S Silva, [place] at 09/24/2013 11:31 AM     Status: Signed        ATC pt and went straight to VM. No able to leave VM bc it has not been set up yet.  Pt also over due for appt with CDY since last seen 06/2012.                  Encounter MyChart Messages     No messages in this encounter             Routing History     Priority Sent On From To Message Type     09/24/2013 11:32 AM Tommie SamsMindy S Silva, CMA [facility] Triage Pool Patient Calls     09/24/2013 11:25 AM [name] [facility] Triage Pool Patient Calls           Created by     [name] on 09/24/2013 11:22 AM                               Visit Pharmacy     [facility] [address] [name] [id number]             Contacts       Type Contact Phone    09/24/2013 11:22 AM Phone (Incoming) Erin [name], Erin [name] (Self) [phone] (H)    Pt states she has a sinus infection & wants an appt w/ CY. Pt states that she isn't able to get reception at work, so she may not be available until after 4:00 today. OK to LM. Pt will not call PCP for appt-states she needs to see CY her allergist.

## 2013-09-24 NOTE — Telephone Encounter (Signed)
atc went straight to voicemail that has not been set up. wtb.

## 2013-09-24 NOTE — Telephone Encounter (Signed)
Pt had mother call in to check on an appt.  Erin [name]

## 2013-09-24 NOTE — Telephone Encounter (Signed)
ATC pt and went straight to VM. No able to leave VM bc it has not been set up yet. Pt also over due for appt with CDY since last seen 06/2012.

## 2013-09-24 NOTE — Telephone Encounter (Signed)
atc the pt again, went straight to recording stating voicemail has not been set up.

## 2013-09-25 NOTE — Telephone Encounter (Signed)
ATC again, no voicemail set-up. Carron CurieJennifer Castillo, CMA

## 2013-09-26 NOTE — Telephone Encounter (Signed)
Per protocol I will sign off on message and await call back. Tremon Sainvil, CMA  

## 2013-09-30 ENCOUNTER — Ambulatory Visit: Payer: BC Managed Care – PPO | Admitting: Internal Medicine

## 2013-10-07 ENCOUNTER — Ambulatory Visit (INDEPENDENT_AMBULATORY_CARE_PROVIDER_SITE_OTHER): Payer: BC Managed Care – PPO | Admitting: Family Medicine

## 2013-10-07 ENCOUNTER — Encounter: Payer: Self-pay | Admitting: Family Medicine

## 2013-10-07 VITALS — BP 118/80 | HR 81 | Temp 98.1°F | Wt 216.0 lb

## 2013-10-07 DIAGNOSIS — J309 Allergic rhinitis, unspecified: Secondary | ICD-10-CM

## 2013-10-07 DIAGNOSIS — F411 Generalized anxiety disorder: Secondary | ICD-10-CM

## 2013-10-07 DIAGNOSIS — J302 Other seasonal allergic rhinitis: Secondary | ICD-10-CM

## 2013-10-07 DIAGNOSIS — J3089 Other allergic rhinitis: Secondary | ICD-10-CM

## 2013-10-07 MED ORDER — CETIRIZINE HCL 10 MG PO TABS: 10.0000 mg | ORAL_TABLET | Freq: Every day | ORAL | Status: DC

## 2013-10-07 MED ORDER — AZELASTINE-FLUTICASONE 137-50 MCG/ACT NA SUSP: 2.0000 | Freq: Every day | NASAL | Status: DC

## 2013-10-07 NOTE — Assessment & Plan Note (Signed)
+   pmdd Take zoloft as directed Consider counselor

## 2013-10-07 NOTE — Progress Notes (Signed)
Pre visit review using our clinic review tool, if applicable. No additional management support is needed unless otherwise documented below in the visit note. 

## 2013-10-07 NOTE — Progress Notes (Signed)
  Subjective:     Erin [name] is a 33 y.o. female who presents for evaluation of sinus pain. Symptoms include: congestion, facial pain, fevers, headaches, nasal congestion and sinus pressure. Onset of symptoms was 2 weeks ago. Symptoms have been gradually improving since that time. Past history is significant for no history of pneumonia or bronchitis. Patient is a non-smoker.  Pt states most symptoms are gone except for a slight cough.   Pt also c/o increase stress and she went to UC and was started on zoloft and xanax but she never took it.  She is also c/o PMS.     The following portions of the patient's history were reviewed and updated as appropriate: allergies, current medications, past family history, past medical history, past social history, past surgical history and problem list.  Review of Systems Pertinent items are noted in HPI.   Objective:    BP 118/80  Pulse 81  Temp(Src) 98.1 F (36.7 C) (Oral)  Wt 216 lb (97.977 kg)  SpO2 97% General appearance: alert, cooperative, appears stated age and no distress Ears: normal TM's and external ear canals both ears Nose: green discharge, moderate congestion, turbinates red, swollen, sinus tenderness bilateral Throat: lips, mucosa, and tongue normal; teeth and gums normal Neck: moderate anterior cervical adenopathy, supple, symmetrical, trachea midline and thyroid not enlarged, symmetric, no tenderness/mass/nodules Lungs: clear to auscultation bilaterally Heart: S1, S2 normal   psych-- iinc stress , teary eyed Assessment:    Acute bacterial sinusitis.    Plan:    refill dysmista and zyrtec

## 2013-10-07 NOTE — Assessment & Plan Note (Signed)
Refill dymista and con't zyrtec

## 2013-10-07 NOTE — Patient Instructions (Signed)
Stress Stress-related medical problems are becoming increasingly common. The body has a built-in physical response to stressful situations. Faced with pressure, challenge or danger, we need to react quickly. Our bodies release hormones such as cortisol and adrenaline to help do this. These hormones are part of the "fight or flight" response and affect the metabolic rate, heart rate and blood pressure, resulting in a heightened, stressed state that prepares the body for optimum performance in dealing with a stressful situation. It is likely that early man required these mechanisms to stay alive, but usually modern stresses do not call for this, and the same hormones released in today's world can damage health and reduce coping ability. CAUSES  Pressure to perform at work, at school or in sports.  Threats of physical violence.  Money worries.  Arguments.  Family conflicts.  Divorce or separation from significant other.  Bereavement.  New job or unemployment.  Changes in location.  Alcohol or drug abuse. SOMETIMES, THERE IS NO PARTICULAR REASON FOR DEVELOPING STRESS. Almost all people are at risk of being stressed at some time in their lives. It is important to know that some stress is temporary and some is long term.  Temporary stress will go away when a situation is resolved. Most people can cope with short periods of stress, and it can often be relieved by relaxing, taking a walk, chatting through issues with friends, or having a good night's sleep.  Chronic (long-term, continuous) stress is much harder to deal with. It can be psychologically and emotionally damaging. It can be harmful both for an individual and for friends and family. SYMPTOMS Everyone reacts to stress differently. There are some common effects that help us recognize it. In times of extreme stress, people may:  Shake uncontrollably.  Breathe faster and deeper than normal (hyperventilate).  Vomit.  For people  with asthma, stress can trigger an attack.  For some people, stress may trigger migraine headaches, ulcers, and body pain. PHYSICAL EFFECTS OF STRESS MAY INCLUDE:  Loss of energy.  Skin problems.  Aches and pains resulting from tense muscles, including neck ache, backache and tension headaches.  Increased pain from arthritis and other conditions.  Irregular heart beat (palpitations).  Periods of irritability or anger.  Apathy or depression.  Anxiety (feeling uptight or worrying).  Unusual behavior.  Loss of appetite.  Comfort eating.  Lack of concentration.  Loss of, or decreased, sex-drive.  Increased smoking, drinking, or recreational drug use.  For women, missed periods.  Ulcers, joint pain, and muscle pain. Post-traumatic stress is the stress caused by any serious accident, strong emotional damage, or extremely difficult or violent experience such as rape or war. Post-traumatic stress victims can experience mixtures of emotions such as fear, shame, depression, guilt or anger. It may include recurrent memories or images that may be haunting. These feelings can last for weeks, months or even years after the traumatic event that triggered them. Specialized treatment, possibly with medicines and psychological therapies, is available. If stress is causing physical symptoms, severe distress or making it difficult for you to function as normal, it is worth seeing your caregiver. It is important to remember that although stress is a usual part of life, extreme or prolonged stress can lead to other illnesses that will need treatment. It is better to visit a doctor sooner rather than later. Stress has been linked to the development of high blood pressure and heart disease, as well as insomnia and depression. There is no diagnostic test for   stress since everyone reacts to it differently. But a caregiver will be able to spot the physical symptoms, such  as:  Headaches.  Shingles.  Ulcers. Emotional distress such as intense worry, low mood or irritability should be detected when the doctor asks pertinent questions to identify any underlying problems that might be the cause. In case there are physical reasons for the symptoms, the doctor may also want to do some tests to exclude certain conditions. If you feel that you are suffering from stress, try to identify the aspects of your life that are causing it. Sometimes you may not be able to change or avoid them, but even a small change can have a positive ripple effect. A simple lifestyle change can make all the difference. STRATEGIES THAT CAN HELP DEAL WITH STRESS:  Delegating or sharing responsibilities.  Avoiding confrontations.  Learning to be more assertive.  Regular exercise.  Avoid using alcohol or street drugs to cope.  Eating a healthy, balanced diet, rich in fruit and vegetables and proteins.  Finding humor or absurdity in stressful situations.  Never taking on more than you know you can handle comfortably.  Organizing your time better to get as much done as possible.  Talking to friends or family and sharing your thoughts and fears.  Listening to music or relaxation tapes.  Tensing and then relaxing your muscles, starting at the toes and working up to the head and neck. If you think that you would benefit from help, either in identifying the things that are causing your stress or in learning techniques to help you relax, see a caregiver who is capable of helping you with this. Rather than relying on medications, it is usually better to try and identify the things in your life that are causing stress and try to deal with them. There are many techniques of managing stress including counseling, psychotherapy, aromatherapy, yoga, and exercise. Your caregiver can help you determine what is best for you. Document Released: 11/19/2002 Document Revised: 11/21/2011 Document  Reviewed: 10/16/2007 Exit[employer] Patient Information 2014 [employer].  

## 2013-10-08 ENCOUNTER — Telehealth: Payer: Self-pay | Admitting: Family Medicine

## 2013-10-08 NOTE — Telephone Encounter (Signed)
Patient called stating she needs a work note for today, 1.27.15. Please call [phone] when ready for pick up.

## 2013-10-09 NOTE — Telephone Encounter (Signed)
Seen on 10/08/13. Please advise      KP

## 2013-10-09 NOTE — Telephone Encounter (Signed)
VM is not set up, Unable to leave a message. Work note placed at check in after multiple attempts to call the patient     > KP

## 2013-10-09 NOTE — Telephone Encounter (Signed)
Ok for note 

## 2013-11-29 NOTE — Telephone Encounter (Signed)
Pt called requesting her work note to be faxed to her work.  [employer], fax [phone], Attn: Prince SolianJamie King. Note was faxed.  bw

## 2014-01-03 ENCOUNTER — Ambulatory Visit (INDEPENDENT_AMBULATORY_CARE_PROVIDER_SITE_OTHER): Payer: BC Managed Care – PPO | Admitting: Internal Medicine

## 2014-01-03 ENCOUNTER — Encounter: Payer: Self-pay | Admitting: Internal Medicine

## 2014-01-03 VITALS — BP 118/82 | HR 72 | Ht 68.0 in | Wt 211.0 lb

## 2014-01-03 DIAGNOSIS — J3089 Other allergic rhinitis: Secondary | ICD-10-CM

## 2014-01-03 DIAGNOSIS — J309 Allergic rhinitis, unspecified: Secondary | ICD-10-CM

## 2014-01-03 DIAGNOSIS — J45909 Unspecified asthma, uncomplicated: Secondary | ICD-10-CM

## 2014-01-03 DIAGNOSIS — J4520 Mild intermittent asthma, uncomplicated: Secondary | ICD-10-CM

## 2014-01-03 DIAGNOSIS — J302 Other seasonal allergic rhinitis: Secondary | ICD-10-CM

## 2014-01-03 MED ORDER — AZELASTINE-FLUTICASONE 137-50 MCG/ACT NA SUSP: 2.0000 | Freq: Every day | NASAL | Status: DC

## 2014-01-03 NOTE — Progress Notes (Signed)
Subjective:    Patient ID: Erin [name], female    DOB: 12/18/1980, 33 y.o.   MRN: [id number]  HPI 04/01/11- [age] yoF never smoker, seen at kind request of Dr Laury AxonLowne for allergy evaluation.  She had been followed at [facility] by Dr Corinda [facility]Le[facility]auer, where skin tests showed 4+ reactions to grasses, dust/ mites, and also oyster, shrimp, clam, crab, trees and weeds. She had done well on allergy vaccine 3 years PTA, dropping off 1.5 years ago, without reactions to the shots.  She has changed jobs and wishes to change care here because we are closer.  She describes childhood allergic rhinitis and exertional asthma. Got worse as an adult. No recent asthma and has never needed ER for asthma.  Complains of rhinorhea, nasal congestion, waking with retroorbital pressure, frontal headaches. Neti pot helps. Nasal sprays help if used. Using cetirizine. Recent thyroidectomy, otherwise no ENT surgery.  Triggers- seafood restaurants- shell fish, shrimp, oysters make throat swell.  Spring pollens, rain/ humid weather. She questions her foam pillow. 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  10//22/13- 31 yoF never smoker followed for allergic rhinitis/ Food allergy She never came back for allergy testing after initial visit.  Pt c/o daily drainage, eye pressure/reddness, sinus HA and photophobia. Pt denies any color change of mucus. Pt interested in flu vax. We sent  prednisone when she called last week, not certain that helped any. Feels a little crackle in her chest but no wheezing. Not using Flonase or Nasonex continues Zyrtec.  01/03/14-32 yoF never smoker followed for allergic rhinitis/ Food allergy, asthma, complicated by goiter Follows for: pt c/o watery eyes, mouth dryness, sinus headaches, X1 month.  Having a lot of trouble with pollen causing itching and watering of eyes and nose with postnasal drip. Chest is comfortable. Using Mucinex D and  Benadryl. Liked Dymista.  ROS-see HPI Constitutional:   No-   weight loss, night sweats, fevers, chills, fatigue, lassitude. HEENT:   No-  headaches, difficulty swallowing, tooth/dental problems, sore throat,      + sneezing,+ itching, ear ache,+ nasal congestion, +post nasal drip,  CV:  No-   chest pain, orthopnea, PND, swelling in lower extremities, anasarca,  dizziness, palpitations Resp: No-   shortness of breath with exertion or at rest.              No-   productive cough,  No non-productive cough,  No- coughing up of blood.              No-   change in color of mucus.  No- wheezing.   Skin: No-   rash or lesions. GI:  No-   heartburn, indigestion, abdominal pain, nausea, vomiting,  GU:  MS:  No-   joint pain or swelling.   Neuro-     nothing unusual Psych:  No- change in mood or affect. No depression or anxiety.  No memory loss.  Objective:   Physical Exam General- Alert, Oriented, Affect-appropriate, Distress- none acute    overweight Skin- rash-none, lesions- none, excoriation- none Lymphadenopathy- none Head- atraumatic            Eyes- Gross vision intact, PERRLA, conjunctivae clear secretions   proptosis            Ears- Hearing, canals, TMs+retracted            Nose- +Turbinate edema, No- Septal dev, mucus, polyps, erosion, perforation  Throat- Mallampati III , mucosa clear , drainage- none, tonsils- atrophic. Thyroid incision scar.               Neck- flexible , trachea midline, no stridor , thyroid nl, carotid no bruit Chest - symmetrical excursion , unlabored           Heart/CV- RRR , no murmur , no gallop  , no rub, nl s1 s2                           - JVD- none , edema- none, stasis changes- none, varices- none           Lung- clear to P&A, wheeze- none, cough- none , dullness-none, rub- none           Chest wall-  Abd-  Br/ Gen/ Rectal- Not done, not indicated Extrem- cyanosis- none, clubbing, none, atrophy- none, strength- nl Neuro- grossly intact to  observation Assessment & Plan:

## 2014-01-03 NOTE — Patient Instructions (Signed)
Sample and script for Dymista nasal spray      1-2 puffs each nostril once daily at bedtime  Try otc antihistamine Allegra/ fexofenadine instead of benadryl  Ok to use the Mucinex-D if you need a decongestant.  Please call as needed

## 2014-01-31 NOTE — Assessment & Plan Note (Signed)
Seasonal exacerbation with rhinitis, conjunctivitis and eustachian dysfunction Plan-fexofenadine, sample and prescription Dymista nasal spray

## 2014-01-31 NOTE — Assessment & Plan Note (Signed)
Controlled.  

## 2014-04-26 ENCOUNTER — Other Ambulatory Visit: Payer: Self-pay | Admitting: Family Medicine

## 2014-06-06 ENCOUNTER — Ambulatory Visit: Payer: BC Managed Care – PPO | Admitting: Internal Medicine

## 2014-06-08 ENCOUNTER — Other Ambulatory Visit: Payer: Self-pay | Admitting: Family Medicine

## 2014-06-09 ENCOUNTER — Telehealth: Payer: Self-pay

## 2014-06-09 DIAGNOSIS — E039 Hypothyroidism, unspecified: Secondary | ICD-10-CM

## 2014-06-09 NOTE — Telephone Encounter (Signed)
Orders in..     KP 

## 2014-06-09 NOTE — Telephone Encounter (Signed)
Message copied by Arnette Norris on Mon Jun 09, 2014 10:49 AM ------      Message from: Dedra Skeens      Created: Mon Jun 09, 2014  9:50 AM             Patient scheduled lab appointment for synthroid refill. Please place labs. ------

## 2014-06-10 ENCOUNTER — Other Ambulatory Visit: Payer: BC Managed Care – PPO

## 2014-06-23 ENCOUNTER — Other Ambulatory Visit: Payer: BC Managed Care – PPO

## 2014-07-14 ENCOUNTER — Encounter: Payer: Self-pay | Admitting: Internal Medicine

## 2014-07-14 ENCOUNTER — Ambulatory Visit (INDEPENDENT_AMBULATORY_CARE_PROVIDER_SITE_OTHER): Payer: BC Managed Care – PPO | Admitting: Family Medicine

## 2014-07-14 VITALS — BP 132/88 | HR 71 | Temp 98.6°F | Wt 215.2 lb

## 2014-07-14 DIAGNOSIS — G4700 Insomnia, unspecified: Secondary | ICD-10-CM

## 2014-07-14 DIAGNOSIS — E038 Other specified hypothyroidism: Secondary | ICD-10-CM

## 2014-07-14 DIAGNOSIS — F430 Acute stress reaction: Secondary | ICD-10-CM

## 2014-07-14 DIAGNOSIS — Z23 Encounter for immunization: Secondary | ICD-10-CM

## 2014-07-14 DIAGNOSIS — R5383 Other fatigue: Secondary | ICD-10-CM

## 2014-07-14 MED ORDER — ALPRAZOLAM 0.25 MG PO TABS: 0.2500 mg | ORAL_TABLET | Freq: Two times a day (BID) | ORAL | Status: DC | PRN

## 2014-07-14 MED ORDER — SYNTHROID 125 MCG PO TABS: ORAL_TABLET | ORAL | Status: DC

## 2014-07-14 MED ORDER — SERTRALINE HCL 50 MG PO TABS: 50.0000 mg | ORAL_TABLET | Freq: Every day | ORAL | Status: DC

## 2014-07-14 NOTE — Progress Notes (Signed)
   Subjective:    Patient ID: Erin [name], female    DOB: 08/26/1981, 33 y.o.   MRN: [id number]  HPI Pt here to discuss thyroid-- she needs her labs checked.  She has been out of meds 3 days.  She c/o extreme fatigue and irregular vaginal bleeding.   Review of Systems As above    Objective:   Physical Exam BP 132/88 mmHg  Pulse 71  Temp(Src) 98.6 F (37 C) (Oral)  Wt 215 lb 3.2 oz (97.614 kg)  SpO2 100%  LMP 07/10/2014 General appearance: alert, cooperative, appears stated age and no distress Neurologic: Mental status: Alert, oriented, thought content appropriate, when questioned about suicide, the patient expresses no suicidal ideation, no homicidal ideation Neck -- supple, no thyromegaly      Assessment & Plan:  1. Other specified hypothyroidism May be causing vaginal bleeding and other problems - SYNTHROID 125 MCG tablet; take 1 tablet by mouth once daily  Dispense: 30 tablet; Refill: 5 - TSH  2. Other fatigue Check labs - Basic metabolic panel - CBC with Differential - Vitamin B12 - TSH  3. Reaction, situational, acute, to stress Will start meds and rto 1 month for f/u - sertraline (ZOLOFT) 50 MG tablet; Take 1 tablet (50 mg total) by mouth daily.  Dispense: 30 tablet; Refill: 3 - ALPRAZolam (XANAX) 0.25 MG tablet; Take 1 tablet (0.25 mg total) by mouth 2 (two) times daily as needed for sleep or anxiety.  Dispense: 30 tablet; Refill: 0  4. Insomnia And stress - ALPRAZolam (XANAX) 0.25 MG tablet; Take 1 tablet (0.25 mg total) by mouth 2 (two) times daily as needed for sleep or anxiety.  Dispense: 30 tablet; Refill: 0

## 2014-07-14 NOTE — Progress Notes (Signed)
Pre visit review using our clinic review tool, if applicable. No additional management support is needed unless otherwise documented below in the visit note. 

## 2014-07-14 NOTE — Patient Instructions (Signed)

## 2014-07-15 LAB — BASIC METABOLIC PANEL
BUN: 14 mg/dL (ref 6–23)
CALCIUM: 9.2 mg/dL (ref 8.4–10.5)
CHLORIDE: 106 meq/L (ref 96–112)
CO2: 30 mEq/L (ref 19–32)
CREATININE: 0.9 mg/dL (ref 0.4–1.2)
GFR: 88.19 mL/min (ref >60.00)
Glucose, Bld: 87 mg/dL (ref 70–99)
Potassium: 4.3 mEq/L (ref 3.5–5.1)
Sodium: 141 mEq/L (ref 135–145)

## 2014-07-15 LAB — CBC WITH DIFFERENTIAL/PLATELET
Basophils Absolute: 0.1 10*3/uL (ref 0.0–0.1)
Basophils Relative: 0.6 % (ref 0.0–3.0)
EOS ABS: 0.1 10*3/uL (ref 0.0–0.7)
EOS PCT: 1.2 % (ref 0.0–5.0)
HCT: 39.5 % (ref 36.0–46.0)
Hemoglobin: 13 g/dL (ref 12.0–15.0)
Lymphocytes Relative: 22.5 % (ref 12.0–46.0)
Lymphs Abs: 2.4 10*3/uL (ref 0.7–4.0)
MCHC: 33 g/dL (ref 30.0–36.0)
MCV: 87.6 fl (ref 78.0–100.0)
MONO ABS: 0.8 10*3/uL (ref 0.1–1.0)
Monocytes Relative: 7.5 % (ref 3.0–12.0)
NEUTROS PCT: 68.2 % (ref 43.0–77.0)
Neutro Abs: 7.2 10*3/uL (ref 1.4–7.7)
PLATELETS: 203 10*3/uL (ref 150.0–400.0)
RBC: 4.51 Mil/uL (ref 3.87–5.11)
RDW: 14.8 % (ref 11.5–15.5)
WBC: 10.6 10*3/uL — AB (ref 4.0–10.5)

## 2014-07-15 LAB — VITAMIN B12: VITAMIN B 12: 413 pg/mL (ref 211–911)

## 2014-07-15 LAB — TSH: TSH: 9.31 u[IU]/mL — AB (ref 0.35–4.50)

## 2014-07-16 ENCOUNTER — Other Ambulatory Visit: Payer: Self-pay

## 2014-07-16 DIAGNOSIS — E038 Other specified hypothyroidism: Secondary | ICD-10-CM

## 2014-07-16 MED ORDER — SYNTHROID 137 MCG PO TABS: 137.0000 ug | ORAL_TABLET | Freq: Every day | ORAL | Status: DC

## 2014-07-16 NOTE — Addendum Note (Signed)
Addended by: Arnette NorrisPAYNE, Svara Twyman P on: 07/16/2014 08:34 AM   Modules accepted: Orders

## 2014-09-01 ENCOUNTER — Ambulatory Visit (INDEPENDENT_AMBULATORY_CARE_PROVIDER_SITE_OTHER): Payer: BC Managed Care – PPO | Admitting: Licensed Clinical Social Worker

## 2014-09-01 DIAGNOSIS — F332 Major depressive disorder, recurrent severe without psychotic features: Secondary | ICD-10-CM

## 2014-09-29 ENCOUNTER — Ambulatory Visit: Payer: BC Managed Care – PPO | Admitting: Licensed Clinical Social Worker

## 2014-10-13 ENCOUNTER — Telehealth: Payer: Self-pay | Admitting: Family Medicine

## 2014-10-13 DIAGNOSIS — E039 Hypothyroidism, unspecified: Secondary | ICD-10-CM

## 2014-10-13 NOTE — Telephone Encounter (Signed)
Caller name:[place] Relation to ZO:XWRUpt:self Call back [phone] Pharmacy:[facility]  Reason for call: pt is needing rx   SYNTHROID 137 MCG tablet

## 2014-10-14 MED ORDER — SYNTHROID 137 MCG PO TABS: 137.0000 ug | ORAL_TABLET | Freq: Every day | ORAL | Status: DC

## 2014-10-14 NOTE — Telephone Encounter (Signed)
Patient requesting this to be refilled. Patient requesting a callback when this has been sent. [facility] [address]

## 2014-10-14 NOTE — Telephone Encounter (Signed)
Rx sent, I have tried to call the patient but her number is not working. Please offer her a lab apt. Orders are in.      KP

## 2014-10-29 ENCOUNTER — Ambulatory Visit (INDEPENDENT_AMBULATORY_CARE_PROVIDER_SITE_OTHER): Payer: BC Managed Care – PPO | Admitting: Medical

## 2014-10-29 ENCOUNTER — Encounter: Payer: Self-pay | Admitting: Medical

## 2014-10-29 VITALS — BP 138/93 | HR 87 | Temp 98.9°F | Ht 68.0 in | Wt 228.4 lb

## 2014-10-29 DIAGNOSIS — J0100 Acute maxillary sinusitis, unspecified: Secondary | ICD-10-CM | POA: Insufficient documentation

## 2014-10-29 MED ORDER — FLUTICASONE PROPIONATE 50 MCG/ACT NA SUSP: 2.0000 | Freq: Every day | NASAL | Status: DC

## 2014-10-29 MED ORDER — HYDROCODONE-HOMATROPINE 5-1.5 MG/5ML PO SYRP: 5.0000 mL | ORAL_SOLUTION | Freq: Three times a day (TID) | ORAL | Status: DC | PRN

## 2014-10-29 MED ORDER — AZITHROMYCIN 250 MG PO TABS: ORAL_TABLET | ORAL | Status: DC

## 2014-10-29 MED ORDER — ALBUTEROL SULFATE HFA 108 (90 BASE) MCG/ACT IN AERS: 2.0000 | INHALATION_SPRAY | Freq: Four times a day (QID) | RESPIRATORY_TRACT | Status: DC | PRN

## 2014-10-29 NOTE — Progress Notes (Signed)
Subjective:    Patient ID: Erin [name], female    DOB: 12/29/1980, 34 y.o.   MRN: [id number]  HPI   Pt in with some sinus pressure since pressure and nasal congestion for one week. Has runny nose as well. Some pnd. No ear pain. No fever, no chills or sweating. Pt has hx of sinus infections 2-3 times a year. Often associated with spring allergies.  Pt does netty pot to prevent but not this time.  Mild upper teeth sensitivity.  lmp- Oct 15, 2014.   Review of Systems  Constitutional: Negative for fever, chills and diaphoresis.  HENT: Positive for congestion, postnasal drip and sinus pressure. Negative for ear pain.   Respiratory: Positive for cough. Negative for chest tightness and wheezing.        Cough last night.   Cardiovascular: Negative for chest pain and palpitations.  Musculoskeletal: Positive for back pain.  Neurological: Negative for dizziness and headaches.  Hematological: Negative for adenopathy. Does not bruise/bleed easily.  Psychiatric/Behavioral: Negative for behavioral problems and confusion.   Past Medical History  Diagnosis Date  . Asthma   . Hypertension   . Abortion history     x3  . Elevated blood pressure     History   Social History  . Marital Status: Single    Spouse Name: n/a  . Number of Children: 0  . Years of Education: Master's   Occupational History  . [employer]   Social History Main Topics  . Smoking status: Never Smoker   . Smokeless tobacco: Never Used  . Alcohol Use: 1.5 oz/week    3 drink(s) per week  . Drug Use: No  . Sexual Activity:    Partners: Male    Birth Control/ Protection: Condom   Other Topics Concern  . Not on file   Social History Narrative   Working on her doctoral degree.  Lives alone.    Past Surgical History  Procedure Laterality Date  . Induced abortion      x3  . Breast surgery      Breast reduction  . Thyroidectomy  10/30/08    [facility]    Family History  Problem  Relation Age of Onset  . Coronary artery disease Father   . Stroke Father   . Hypertension Mother   . Diabetes Maternal Aunt   . Lung cancer      great uncle    Allergies  Allergen Reactions  . Sulfonamide Derivatives     Current Outpatient Prescriptions on File Prior to Visit  Medication Sig Dispense Refill  . albuterol (VENTOLIN HFA) 108 (90 BASE) MCG/ACT inhaler Inhale 2 puffs into the lungs every 6 (six) hours as needed.      . ALPRAZolam (XANAX) 0.25 MG tablet Take 1 tablet (0.25 mg total) by mouth 2 (two) times daily as needed for sleep or anxiety. 30 tablet 0  . Azelastine-Fluticasone 137-50 MCG/ACT SUSP Place 2 sprays into the nose at bedtime. 23 g 2  . cetirizine (ZYRTEC) 10 MG tablet Take 1 tablet (10 mg total) by mouth daily. 30 tablet 11  . Cyanocobalamin (VITAMIN B-12) 2500 MCG SUBL Place 1 tablet under the tongue daily.      . Multiple Vitamin (MULTIVITAMIN) tablet Take 1 tablet by mouth daily.      . sertraline (ZOLOFT) 50 MG tablet Take 1 tablet (50 mg total) by mouth daily. 30 tablet 3  . SYNTHROID 137 MCG tablet Take 1 tablet (137 mcg total)  by mouth daily before breakfast. Recheck TSH in 2 months--labs are due now! 30 tablet 0   No current facility-administered medications on file prior to visit.    BP 138/93 mmHg  Pulse 87  Temp(Src) 98.9 F (37.2 C) (Oral)  Ht  (1.727 m)  Wt 228 lb 6.4 oz (103.602 kg)  BMI 34.74 kg/m2  SpO2 95%  LMP 10/15/2014      Objective:   Physical Exam  General  Mental Status - Alert. General Appearance - Well groomed. Not in acute distress.  Skin Rashes- No Rashes.  HEENT Head- Normal. Ear Auditory Canal - Left- Normal. Right - Normal.Tympanic Membrane- Left- Normal. Right- Normal. Eye Sclera/Conjunctiva- Left- Normal. Right- Normal. Nose & Sinuses Nasal Mucosa- Left-  Boggy and Congested. Right-  Boggy and  Congested.Bilateral maxillary and frontal sinus pressure. Mouth & Throat Lips: Upper Lip- Normal: no  dryness, cracking, pallor, cyanosis, or vesicular eruption. Lower Lip-Normal: no dryness, cracking, pallor, cyanosis or vesicular eruption. Buccal Mucosa- Bilateral- No Aphthous ulcers. Oropharynx- No Discharge or Erythema. Tonsils: Characteristics- Bilateral- No Erythema or Congestion. Size/Enlargement- Bilateral- No enlargement. Discharge- bilateral-None.  Neck Neck- Supple. No Masses.   Chest and Lung Exam Auscultation: Breath Sounds:-Clear even and unlabored.  Cardiovascular Auscultation:Rythm- Regular, rate and rhythm. Murmurs & Other Heart Sounds:Ausculatation of the heart reveal- No Murmurs.  Lymphatic Head & Neck General Head & Neck Lymphatics: Bilateral: Description- No Localized lymphadenopathy.      Assessment & Plan:

## 2014-10-29 NOTE — Progress Notes (Signed)
Pre visit review using our clinic review tool, if applicable. No additional management support is needed unless otherwise documented below in the visit note. 

## 2014-10-29 NOTE — Patient Instructions (Signed)
Sinusitis, acute maxillary Your appear to have a sinus infection. I am prescribing  azthromycin antibiotic for the infection(pt request). To help with the nasal congestion I prescribed flonase  nasal steroid. For your associated cough, I prescribed cough medicine hydromet.  Rest, hydrate, tylenol for fever.  Follow up in 7 days or as needed.

## 2014-10-29 NOTE — Assessment & Plan Note (Signed)
Your appear to have a sinus infection. I am prescribing  azthromycin antibiotic for the infection(pt request). To help with the nasal congestion I prescribed flonase  nasal steroid. For your associated cough, I prescribed cough medicine hydromet.  Rest, hydrate, tylenol for fever.  Follow up in 7 days or as needed.

## 2014-11-18 ENCOUNTER — Other Ambulatory Visit (INDEPENDENT_AMBULATORY_CARE_PROVIDER_SITE_OTHER): Payer: BC Managed Care – PPO

## 2014-11-18 ENCOUNTER — Ambulatory Visit (INDEPENDENT_AMBULATORY_CARE_PROVIDER_SITE_OTHER): Payer: BC Managed Care – PPO | Admitting: Family Medicine

## 2014-11-18 DIAGNOSIS — E890 Postprocedural hypothyroidism: Secondary | ICD-10-CM

## 2014-11-18 DIAGNOSIS — E039 Hypothyroidism, unspecified: Secondary | ICD-10-CM

## 2014-11-18 MED ORDER — SYNTHROID 137 MCG PO TABS: 137.0000 ug | ORAL_TABLET | Freq: Every day | ORAL | Status: DC

## 2014-11-18 NOTE — Progress Notes (Signed)
Pre visit review using our clinic review tool, if applicable. No additional management support is needed unless otherwise documented below in the visit note. 

## 2014-11-19 LAB — TSH: TSH: 2.71 u[IU]/mL (ref 0.35–4.50)

## 2014-12-29 ENCOUNTER — Other Ambulatory Visit: Payer: Self-pay

## 2014-12-29 MED ORDER — SYNTHROID 137 MCG PO TABS: 137.0000 ug | ORAL_TABLET | Freq: Every day | ORAL | Status: DC

## 2015-01-15 ENCOUNTER — Ambulatory Visit: Payer: BC Managed Care – PPO | Admitting: Obstetrics

## 2015-01-22 ENCOUNTER — Ambulatory Visit: Payer: BC Managed Care – PPO | Admitting: Obstetrics

## 2015-10-06 ENCOUNTER — Ambulatory Visit (INDEPENDENT_AMBULATORY_CARE_PROVIDER_SITE_OTHER): Payer: BC Managed Care – PPO | Admitting: Family Medicine

## 2015-10-06 ENCOUNTER — Encounter: Payer: Self-pay | Admitting: Family Medicine

## 2015-10-06 VITALS — BP 122/89 | HR 80 | Temp 98.0°F | Wt 220.8 lb

## 2015-10-06 DIAGNOSIS — F32A Depression, unspecified: Secondary | ICD-10-CM

## 2015-10-06 DIAGNOSIS — F329 Major depressive disorder, single episode, unspecified: Secondary | ICD-10-CM

## 2015-10-06 DIAGNOSIS — F418 Other specified anxiety disorders: Secondary | ICD-10-CM

## 2015-10-06 DIAGNOSIS — F419 Anxiety disorder, unspecified: Secondary | ICD-10-CM

## 2015-10-06 DIAGNOSIS — T733XXA Exhaustion due to excessive exertion, initial encounter: Secondary | ICD-10-CM | POA: Diagnosis not present

## 2015-10-06 MED ORDER — ALPRAZOLAM 0.25 MG PO TABS: 0.2500 mg | ORAL_TABLET | Freq: Two times a day (BID) | ORAL | Status: DC | PRN

## 2015-10-06 MED ORDER — ESCITALOPRAM OXALATE 10 MG PO TABS: 10.0000 mg | ORAL_TABLET | Freq: Every day | ORAL | Status: DC

## 2015-10-06 NOTE — Progress Notes (Signed)
Patient ID: Erin [name], female    DOB: 02-04-1981  Age: 35 y.o. MRN: [id number]    Subjective:  Subjective HPI [name] Alberts presents for c/o fatigue / depression and anxiety  Review of Systems  Constitutional: Negative for diaphoresis, appetite change, fatigue and unexpected weight change.  Eyes: Negative for pain, redness and visual disturbance.  Respiratory: Negative for cough, chest tightness, shortness of breath and wheezing.   Cardiovascular: Negative for chest pain, palpitations and leg swelling.  Endocrine: Negative for cold intolerance, heat intolerance, polydipsia, polyphagia and polyuria.  Genitourinary: Negative for dysuria, frequency and difficulty urinating.  Neurological: Negative for dizziness, light-headedness, numbness and headaches.  Psychiatric/Behavioral: Positive for decreased concentration. The patient is nervous/anxious.     History Past Medical History  Diagnosis Date  . Asthma   . Hypertension   . Abortion history     x3  . Elevated blood pressure     She has past surgical history that includes Induced abortion; Breast surgery; and Thyroidectomy (10/30/08).   Her family history includes Coronary artery disease in her father; Diabetes in her maternal aunt; Hypertension in her mother; Stroke in her father.She reports that she has never smoked. She has never used smokeless tobacco. She reports that she drinks about 1.5 oz of alcohol per week. She reports that she does not use illicit drugs.  Current Outpatient Prescriptions on File Prior to Visit  Medication Sig Dispense Refill  . albuterol (VENTOLIN HFA) 108 (90 BASE) MCG/ACT inhaler Inhale 2 puffs into the lungs every 6 (six) hours as needed.      . Azelastine-Fluticasone 137-50 MCG/ACT SUSP Place 2 sprays into the nose at bedtime. 23 g 2  . cetirizine (ZYRTEC) 10 MG tablet Take 1 tablet (10 mg total) by mouth daily. 30 tablet 11  . Cyanocobalamin (VITAMIN B-12) 2500 MCG SUBL Place 1 tablet under the  tongue daily.      . fluticasone (FLONASE) 50 MCG/ACT nasal spray Place 2 sprays into both nostrils daily. 16 g 1  . Multiple Vitamin (MULTIVITAMIN) tablet Take 1 tablet by mouth daily.      Marland Kitchen SYNTHROID 137 MCG tablet Take 1 tablet (137 mcg total) by mouth daily before breakfast. 30 tablet 11   No current facility-administered medications on file prior to visit.     Objective:  Objective Physical Exam  Constitutional: She is oriented to person, place, and time. She appears well-developed and well-nourished.  HENT:  Head: Normocephalic and atraumatic.  Eyes: Conjunctivae and EOM are normal.  Neck: Normal range of motion. Neck supple. No JVD present. Carotid bruit is not present. No thyromegaly present.  Cardiovascular: Normal rate, regular rhythm and normal heart sounds.   No murmur heard. Pulmonary/Chest: Effort normal and breath sounds normal. No respiratory distress. She has no wheezes. She has no rales. She exhibits no tenderness.  Musculoskeletal: She exhibits no edema.  Neurological: She is alert and oriented to person, place, and time.  Psychiatric: She has a normal mood and affect.   BP 122/89 mmHg  Pulse 80  Temp(Src) 98 F (36.7 C) (Oral)  Wt 220 lb 12.8 oz (100.154 kg)  SpO2 98%  LMP 10/03/2015 Wt Readings from Last 3 Encounters:  10/06/15 220 lb 12.8 oz (100.154 kg)  10/29/14 228 lb 6.4 oz (103.602 kg)  07/14/14 215 lb 3.2 oz (97.614 kg)     Lab Results  Component Value Date   WBC 10.6* 07/14/2014   HGB 13.0 07/14/2014   HCT 39.5 07/14/2014  PLT 203.0 07/14/2014   GLUCOSE 87 07/14/2014   ALT 10 06/18/2013   AST 15 06/18/2013   NA 141 07/14/2014   K 4.3 07/14/2014   CL 106 07/14/2014   CREATININE 0.9 07/14/2014   BUN 14 07/14/2014   CO2 30 07/14/2014   TSH 2.71 11/18/2014    No results found.   Assessment & Plan:  Plan I have discontinued Ms. Elmendorf's sertraline, ALPRAZolam, HYDROcodone-homatropine, and azithromycin. I am also having her start on  ALPRAZolam. Additionally, I am having her maintain her albuterol, multivitamin, Vitamin B-12, cetirizine, Azelastine-Fluticasone, fluticasone, SYNTHROID, and escitalopram.  Meds ordered this encounter  Medications  . DISCONTD: escitalopram (LEXAPRO) 10 MG tablet    Sig: Take 1 tablet (10 mg total) by mouth daily.    Dispense:  30 tablet    Refill:  3  . ALPRAZolam (XANAX) 0.25 MG tablet    Sig: Take 1 tablet (0.25 mg total) by mouth 2 (two) times daily as needed for anxiety.    Dispense:  30 tablet    Refill:  0  . escitalopram (LEXAPRO) 10 MG tablet    Sig: Take 1 tablet (10 mg total) by mouth daily.    Dispense:  30 tablet    Refill:  3    Problem List Items Addressed This Visit      Unprioritized   Anxiety and depression    rto 1 month Counseling recommended       Other Visit Diagnoses    Fatigue due to excessive exertion, initial encounter    -  Primary    Relevant Orders    CBC with Differential/Platelet    TSH    Vitamin B12    POCT urinalysis dipstick    Depression with anxiety        Relevant Medications    ALPRAZolam (XANAX) 0.25 MG tablet    escitalopram (LEXAPRO) 10 MG tablet       Follow-up: Return in about 4 weeks (around 11/03/2015), or if symptoms worsen or fail to improve, for anxiety.  Loreen Freud, DO

## 2015-10-06 NOTE — Assessment & Plan Note (Signed)
rto 1 month Counseling recommended

## 2015-10-06 NOTE — Patient Instructions (Signed)

## 2015-10-06 NOTE — Progress Notes (Signed)
Pre visit review using our clinic review tool, if applicable. No additional management support is needed unless otherwise documented below in the visit note. 

## 2015-12-31 ENCOUNTER — Ambulatory Visit (INDEPENDENT_AMBULATORY_CARE_PROVIDER_SITE_OTHER): Payer: BC Managed Care – PPO | Admitting: Family Medicine

## 2015-12-31 ENCOUNTER — Encounter: Payer: Self-pay | Admitting: Family Medicine

## 2015-12-31 VITALS — BP 116/74 | HR 81 | Temp 98.6°F | Ht 68.0 in | Wt 203.1 lb

## 2015-12-31 DIAGNOSIS — E039 Hypothyroidism, unspecified: Secondary | ICD-10-CM | POA: Diagnosis not present

## 2015-12-31 DIAGNOSIS — Z0000 Encounter for general adult medical examination without abnormal findings: Secondary | ICD-10-CM

## 2015-12-31 DIAGNOSIS — E890 Postprocedural hypothyroidism: Secondary | ICD-10-CM | POA: Diagnosis not present

## 2015-12-31 DIAGNOSIS — F418 Other specified anxiety disorders: Secondary | ICD-10-CM

## 2015-12-31 LAB — THYROID PANEL WITH TSH
Free Thyroxine Index: 2.4 (ref 1.4–3.8)
T3 Uptake: 36 % — ABNORMAL HIGH (ref 22–35)
T4 TOTAL: 6.7 ug/dL (ref 4.5–12.0)
TSH: 3 m[IU]/L

## 2015-12-31 NOTE — Assessment & Plan Note (Signed)
Check labs con't meds 

## 2015-12-31 NOTE — Progress Notes (Signed)
Patient ID: [name] Brandau, female    DOB: 07/06/1981  Age: 35 y.o. MRN: [id number]    Subjective:  Subjective HPI Erin [name] presents for f/u cough and thyroid.  [facility] and dx with bronchitis and put on augmentin and allergy medication.    Review of Systems  Constitutional: Negative for diaphoresis, appetite change, fatigue and unexpected weight change.  Eyes: Negative for pain, redness and visual disturbance.  Respiratory: Positive for cough. Negative for chest tightness, shortness of breath and wheezing.   Cardiovascular: Negative for chest pain, palpitations and leg swelling.  Endocrine: Negative for cold intolerance, heat intolerance, polydipsia, polyphagia and polyuria.  Genitourinary: Negative for dysuria, frequency and difficulty urinating.  Neurological: Negative for dizziness, light-headedness, numbness and headaches.    History Past Medical History  Diagnosis Date  . Asthma   . Hypertension   . Abortion history     x3  . Elevated blood pressure     She has past surgical history that includes Induced abortion; Breast surgery; and Thyroidectomy (10/30/08).   Her family history includes Coronary artery disease in her father; Diabetes in her maternal aunt; Hypertension in her mother; Stroke in her father.She reports that she has never smoked. She has never used smokeless tobacco. She reports that she drinks about 1.5 oz of alcohol per week. She reports that she does not use illicit drugs.  Current Outpatient Prescriptions on File Prior to Visit  Medication Sig Dispense Refill  . albuterol (VENTOLIN HFA) 108 (90 BASE) MCG/ACT inhaler Inhale 2 puffs into the lungs every 6 (six) hours as needed.      . ALPRAZolam (XANAX) 0.25 MG tablet Take 1 tablet (0.25 mg total) by mouth 2 (two) times daily as needed for anxiety. 30 tablet 0  . Azelastine-Fluticasone 137-50 MCG/ACT SUSP Place 2 sprays into the nose at bedtime. 23 g 2  . cetirizine (ZYRTEC) 10 MG  tablet Take 1 tablet (10 mg total) by mouth daily. 30 tablet 11  . Cyanocobalamin (VITAMIN B-12) 2500 MCG SUBL Place 1 tablet under the tongue daily.      . Multiple Vitamin (MULTIVITAMIN) tablet Take 1 tablet by mouth daily.       No current facility-administered medications on file prior to visit.     Objective:  Objective Physical Exam  Constitutional: She is oriented to person, place, and time. She appears well-developed and well-nourished.  HENT:  Head: Normocephalic and atraumatic.  Eyes: Conjunctivae and EOM are normal.  Neck: Normal range of motion. Neck supple. No JVD present. Carotid bruit is not present. No thyromegaly present.  Cardiovascular: Normal rate, regular rhythm and normal heart sounds.   No murmur heard. Pulmonary/Chest: Effort normal and breath sounds normal. No respiratory distress. She has no wheezes. She has no rales. She exhibits no tenderness.  Musculoskeletal: She exhibits no edema.  Neurological: She is alert and oriented to person, place, and time.  Psychiatric: She has a normal mood and affect. Her behavior is normal. Judgment and thought content normal.  Nursing note and vitals reviewed.  BP 116/74 mmHg  Pulse 81  Temp(Src) 98.6 F (37 C) (Oral)  Ht 5\' 8"  (1.727 m)  Wt 203 lb 2 oz (92.137 kg)  BMI 30.89 kg/m2  SpO2 96%  LMP 12/21/2015 (Approximate) Wt Readings from Last 3 Encounters:  12/31/15 203 lb 2 oz (92.137 kg)  10/06/15 220 lb 12.8 oz (100.154 kg)  10/29/14 228 lb 6.4 oz (103.602 kg)     Lab  Results  Component Value Date   WBC 8.7 12/31/2015   HGB 13.2 12/31/2015   HCT 39.3 12/31/2015   PLT 159.0 12/31/2015   GLUCOSE 83 12/31/2015   ALT 12 12/31/2015   AST 18 12/31/2015   NA 140 12/31/2015   K 4.1 12/31/2015   CL 104 12/31/2015   CREATININE 1.03 12/31/2015   BUN 19 12/31/2015   CO2 28 12/31/2015   TSH 3.00 12/31/2015    No results found.   Assessment & Plan:  Plan I have discontinued Ms. Wisinski's fluticasone. I am  also having her maintain her albuterol, multivitamin, Vitamin B-12, cetirizine, Azelastine-Fluticasone, ALPRAZolam, SYNTHROID, and escitalopram.  Meds ordered this encounter  Medications  . SYNTHROID 137 MCG tablet    Sig: Take 1 tablet (137 mcg total) by mouth daily before breakfast.    Dispense:  30 tablet    Refill:  11  . escitalopram (LEXAPRO) 10 MG tablet    Sig: Take 1 tablet (10 mg total) by mouth daily.    Dispense:  30 tablet    Refill:  3    Problem List Items Addressed This Visit      Unprioritized   POSTSURGICAL HYPOTHYROIDISM    Check labs con't meds       Relevant Medications   SYNTHROID 137 MCG tablet    Other Visit Diagnoses    Hypothyroidism, unspecified hypothyroidism type    -  Primary    Relevant Medications    SYNTHROID 137 MCG tablet    Other Relevant Orders    Comprehensive metabolic panel (Completed)    Thyroid Panel With TSH (Completed)    Vitamin D 1,25 dihydroxy (Completed)    Vitamin B12 (Completed)    CBC with Differential/Platelet (Completed)    Preventative health care        Relevant Orders    Comprehensive metabolic panel (Completed)    Thyroid Panel With TSH (Completed)    Vitamin D 1,25 dihydroxy (Completed)    Vitamin B12 (Completed)    CBC with Differential/Platelet (Completed)    Depression with anxiety        Relevant Medications    escitalopram (LEXAPRO) 10 MG tablet       Follow-up: Return in about 3 months (around 03/31/2016), or if symptoms worsen or fail to improve, for annual exam, fasting.  Donato Schultz, DO

## 2015-12-31 NOTE — Progress Notes (Signed)
Pre visit review using our clinic review tool, if applicable. No additional management support is needed unless otherwise documented below in the visit note. 

## 2015-12-31 NOTE — Patient Instructions (Signed)

## 2016-01-01 LAB — COMPREHENSIVE METABOLIC PANEL
ALBUMIN: 4.3 g/dL (ref 3.5–5.2)
ALK PHOS: 49 U/L (ref 39–117)
ALT: 12 U/L (ref 0–35)
AST: 18 U/L (ref 0–37)
BUN: 19 mg/dL (ref 6–23)
CO2: 28 mEq/L (ref 19–32)
Calcium: 9.4 mg/dL (ref 8.4–10.5)
Chloride: 104 mEq/L (ref 96–112)
Creatinine, Ser: 1.03 mg/dL (ref 0.40–1.20)
GFR: 77.74 mL/min (ref >60.00)
Glucose, Bld: 83 mg/dL (ref 70–99)
POTASSIUM: 4.1 meq/L (ref 3.5–5.1)
Sodium: 140 mEq/L (ref 135–145)
TOTAL PROTEIN: 7.2 g/dL (ref 6.0–8.3)
Total Bilirubin: 0.4 mg/dL (ref 0.2–1.2)

## 2016-01-01 LAB — CBC WITH DIFFERENTIAL/PLATELET
Basophils Absolute: 0 10*3/uL (ref 0.0–0.1)
Basophils Relative: 0.2 % (ref 0.0–3.0)
EOS PCT: 1.1 % (ref 0.0–5.0)
Eosinophils Absolute: 0.1 10*3/uL (ref 0.0–0.7)
HEMATOCRIT: 39.3 % (ref 36.0–46.0)
Hemoglobin: 13.2 g/dL (ref 12.0–15.0)
LYMPHS ABS: 2.3 10*3/uL (ref 0.7–4.0)
LYMPHS PCT: 26.6 % (ref 12.0–46.0)
MCHC: 33.5 g/dL (ref 30.0–36.0)
MCV: 87.9 fl (ref 78.0–100.0)
MONOS PCT: 6 % (ref 3.0–12.0)
Monocytes Absolute: 0.5 10*3/uL (ref 0.1–1.0)
NEUTROS PCT: 66.1 % (ref 43.0–77.0)
Neutro Abs: 5.8 10*3/uL (ref 1.4–7.7)
Platelets: 159 10*3/uL (ref 150.0–400.0)
RBC: 4.47 Mil/uL (ref 3.87–5.11)
RDW: 14.6 % (ref 11.5–15.5)
WBC: 8.7 10*3/uL (ref 4.0–10.5)

## 2016-01-01 LAB — VITAMIN B12: VITAMIN B 12: 645 pg/mL (ref 211–911)

## 2016-01-02 MED ORDER — SYNTHROID 137 MCG PO TABS: 137.0000 ug | ORAL_TABLET | Freq: Every day | ORAL | Status: DC

## 2016-01-02 MED ORDER — ESCITALOPRAM OXALATE 10 MG PO TABS: 10.0000 mg | ORAL_TABLET | Freq: Every day | ORAL | Status: DC

## 2016-01-04 LAB — VITAMIN D 1,25 DIHYDROXY
Vitamin D 1, 25 (OH)2 Total: 64 pg/mL (ref 18–72)
Vitamin D2 1, 25 (OH)2: <8 pg/mL
Vitamin D3 1, 25 (OH)2: 64 pg/mL

## 2016-01-11 ENCOUNTER — Ambulatory Visit (INDEPENDENT_AMBULATORY_CARE_PROVIDER_SITE_OTHER): Payer: BC Managed Care – PPO | Admitting: Acute Care

## 2016-01-11 ENCOUNTER — Telehealth: Payer: Self-pay | Admitting: Family Medicine

## 2016-01-11 ENCOUNTER — Encounter: Payer: Self-pay | Admitting: Acute Care

## 2016-01-11 VITALS — BP 120/84 | HR 97 | Temp 98.0°F | Ht 68.0 in | Wt 197.8 lb

## 2016-01-11 DIAGNOSIS — J0100 Acute maxillary sinusitis, unspecified: Secondary | ICD-10-CM | POA: Diagnosis not present

## 2016-01-11 DIAGNOSIS — J4520 Mild intermittent asthma, uncomplicated: Secondary | ICD-10-CM | POA: Diagnosis not present

## 2016-01-11 MED ORDER — AZITHROMYCIN 250 MG PO TABS: ORAL_TABLET | ORAL | Status: DC

## 2016-01-11 MED ORDER — LEVALBUTEROL HCL 0.63 MG/3ML IN NEBU
0.6300 mg | INHALATION_SOLUTION | Freq: Once | RESPIRATORY_TRACT | Status: AC
  Administered 2016-01-11: 0.63 mg via RESPIRATORY_TRACT

## 2016-01-11 MED ORDER — HYDROCODONE-HOMATROPINE 5-1.5 MG/5ML PO SYRP: 5.0000 mL | ORAL_SOLUTION | Freq: Four times a day (QID) | ORAL | Status: DC | PRN

## 2016-01-11 MED ORDER — METHYLPREDNISOLONE ACETATE 80 MG/ML IJ SUSP
80.0000 mg | Freq: Once | INTRAMUSCULAR | Status: AC
  Administered 2016-01-11: 80 mg via INTRAMUSCULAR

## 2016-01-11 MED ORDER — ALBUTEROL SULFATE HFA 108 (90 BASE) MCG/ACT IN AERS: 2.0000 | INHALATION_SPRAY | Freq: Four times a day (QID) | RESPIRATORY_TRACT | Status: DC | PRN

## 2016-01-11 NOTE — Addendum Note (Signed)
Addended by: York RamGAY, Aaronmichael Brumbaugh on: 01/11/2016 12:08 PM   Modules accepted: Orders

## 2016-01-11 NOTE — Telephone Encounter (Signed)
Caller name: Self  Can be reached: [phone]   Reason for call: Request call back with lab results

## 2016-01-11 NOTE — Progress Notes (Signed)
   Subjective:    Patient ID: Erin [name], female    DOB: 04/27/1981, 35 y.o.   MRN: [id number]  HPI [age]  yoF never smoker followed for allergic rhinitis/ Food allergy by Dr. Maple HudsonYoung.  01/11/2016 Acute OV: Pt. Presents to the office with worsening shortness of breath in the heat. Sinus infection treated with Augmentin  3 weeks ago, but still febrile with purulent secretions from her sinuses.She is unable to sleep due to cough wheezing at night,and therefore is. very fatigued. Afebrile in the office , but states low grade temp Sunday ( yesterday).Still with green, yellow secretions from her nose. Body aches. She does not have a rescue inhaler for when she is short of breath.   Review of Systems  Constitutional:   No  weight loss, night sweats, + Fevers, +chills, +fatigue, or  lassitude.  HEENT:   No headaches,  Difficulty swallowing,  Tooth/dental problems, or  Sore throat,                No sneezing,+ itching, ear ache, +nasal congestion, +post nasal drip,   CV:  No chest pain,  Orthopnea, PND, swelling in lower extremities, anasarca, dizziness, palpitations, syncope.   GI  No heartburn, indigestion, abdominal pain, nausea, vomiting, diarrhea, change in bowel habits, loss of appetite, bloody stools.   Resp: + shortness of breath with exertion or at rest.  + excess mucus, + productive cough,  No non-productive cough,  No coughing up of blood.  + change in color of mucus.  + wheezing.  No chest wall deformity  Skin: no rash or lesions.  GU: no dysuria, change in color of urine, no urgency or frequency.  No flank pain, no hematuria   MS:  No joint pain or swelling.  No decreased range of motion.  No back pain.  Psych:  No change in mood or affect. No depression or anxiety.  No memory loss.        Objective:   Physical Exam BP 120/84 mmHg  Pulse 97  Temp(Src) 98 F (36.7 C) (Oral)  Ht 5\' 8"  (1.727 m)  Wt 197 lb 12.8 oz (89.721 kg)  BMI 30.08 kg/m2  SpO2 96%  LMP 12/21/2015  (Approximate)  Physical Exam:  General- No distress,  A&Ox3, appears tired ENT: + sinus tenderness, TM clear, Inflamed, edematous, nasal mucosa, no oral exudate,+ post nasal drip, no LAN Cardiac: S1, S2, regular rate and rhythm, no murmur Chest: + wheeze/ no rales/ dullness; no accessory muscle use, no nasal flaring, no sternal retractions Abd.: Soft Non-tender Ext: No clubbing cyanosis, edema Neuro:  normal strength Skin: No rashes, warm and dry Psych: normal mood and behavior  Bevelyn NgoSarah F. Valiant [name], AGACNP-BC [facility]  01/11/2016    Assessment & Plan:

## 2016-01-11 NOTE — Assessment & Plan Note (Signed)
Mild Flare with sinusitis and allergies Plan: We will do a depo medrol injection today We will prescribe you a Pro Air inhaler. We will give you a nebulizer treatment today in the office. Follow up in 2 weeks to make sure you are better. Continue with your allergy regimen. Consider PFT's when over acute flare to determine if asthma maintenance medication is necessary. Please contact office for sooner follow up if symptoms do not improve or worsen or seek emergency care.

## 2016-01-11 NOTE — Telephone Encounter (Signed)
I called the number below and the patient was gone for the day, I tried calling the cell but her mailbox was full. Unable to leave a message.      KP

## 2016-01-11 NOTE — Patient Instructions (Addendum)
It is nice to meet you today. I am sorry you not feeling well today. We will phone in a prescription for a Z-pack We will give you a prescription for Hydromet cough syrup 5cc every 6 hours for cough. It will make you sleepy Don't drive while sleepy. We will do a depo medrol injection today We will prescribe you a Pro Air inhaler. We will give you a nebulizer treatment today in the office. Follow up in 2 weeks to make sure you are better. Continue with your allergy regimen. Consider PFT's when over acute flare to determine if asthma maintenance medication is necessary. Please contact office for sooner follow up if symptoms do not improve or worsen or seek emergency care.

## 2016-01-11 NOTE — Assessment & Plan Note (Signed)
Slow to resolve sinus infection. Fever Purulent secretions Cough  Plan: We will phone in a prescription for a Z-pack We will give you a prescription for Hydromet cough syrup 5cc every 6 hours for cough. It will make you sleepy Don't drive while sleepy. Follow up in 2 weeks to make sure you are improving Please contact office for sooner follow up if symptoms do not improve or worsen or seek emergency care

## 2016-01-14 NOTE — Telephone Encounter (Signed)
Normal  Recheck 1 year  Patient aware of the results and verbalized understanding, no questions or concerns at this time.    KP

## 2016-01-19 ENCOUNTER — Other Ambulatory Visit: Payer: Self-pay | Admitting: Family Medicine

## 2016-01-25 ENCOUNTER — Encounter: Payer: Self-pay | Admitting: Adult Health

## 2016-01-25 ENCOUNTER — Ambulatory Visit (INDEPENDENT_AMBULATORY_CARE_PROVIDER_SITE_OTHER)
Admission: RE | Admit: 2016-01-25 | Discharge: 2016-01-25 | Disposition: A | Discharge status: Home / Self Care | Payer: BC Managed Care – PPO | Source: Ambulatory Visit | Attending: Adult Health | Admitting: Adult Health

## 2016-01-25 ENCOUNTER — Ambulatory Visit (INDEPENDENT_AMBULATORY_CARE_PROVIDER_SITE_OTHER): Payer: BC Managed Care – PPO | Admitting: Adult Health

## 2016-01-25 VITALS — BP 136/88 | HR 82 | Temp 98.2°F | Ht 68.0 in | Wt 198.0 lb

## 2016-01-25 DIAGNOSIS — R05 Cough: Secondary | ICD-10-CM

## 2016-01-25 DIAGNOSIS — R059 Cough, unspecified: Secondary | ICD-10-CM

## 2016-01-25 DIAGNOSIS — J0100 Acute maxillary sinusitis, unspecified: Secondary | ICD-10-CM

## 2016-01-25 MED ORDER — FLUCONAZOLE 150 MG PO TABS: 150.0000 mg | ORAL_TABLET | Freq: Every day | ORAL | Status: DC

## 2016-01-25 MED ORDER — AMOXICILLIN-POT CLAVULANATE 875-125 MG PO TABS: 1.0000 | ORAL_TABLET | Freq: Two times a day (BID) | ORAL | Status: AC

## 2016-01-25 NOTE — Patient Instructions (Addendum)
Chest xray today .  Begin Augmentin 875mg  Twice daily  For 10 days  Mucinex DM Twice daily  As needed  Cough/congestion .  Saline nasal rinses As needed   Continue on Dymista nasal daily .  Begin Probiotic daily while on antibiotic.  Diflucan to have on hold if develop yeast infection.  follow up Dr. Maple HudsonYoung  In 6-8 weeks and As needed   Please contact office for sooner follow up if symptoms do not improve or worsen or seek emergency care

## 2016-01-26 ENCOUNTER — Telehealth: Payer: Self-pay | Admitting: Internal Medicine

## 2016-01-26 NOTE — Progress Notes (Signed)
Quick Note:  Spoke with pt. Discussed cxr results and recs per TP. Pt verbalized understanding and voiced no further questions or concerns at this time. ______

## 2016-01-26 NOTE — Telephone Encounter (Signed)
Result Notes     Notes Recorded by Julio Sicksammy S Parrett, NP on 01/26/2016 at 9:52 AM CXR is clear  Cont w/ ov recs Please contact office for sooner follow up if symptoms do not improve or worsen or seek emergency care   --------  Spoke with pt.  Discussed above cxr results and recs per TP.  Pt verbalized understanding and voiced no further questions or concerns at this time.

## 2016-01-27 NOTE — Assessment & Plan Note (Signed)
Slow to resolve exacerbation , Zpack failure  Check cxr today   Plan  Chest xray today .  Begin Augmentin 875mg  Twice daily  For 10 days  Mucinex DM Twice daily  As needed  Cough/congestion .  Saline nasal rinses As needed   Continue on Dymista nasal daily .  Begin Probiotic daily while on antibiotic.  Diflucan to have on hold if develop yeast infection.  follow up Dr. Maple HudsonYoung  In 6-8 weeks and As needed   Please contact office for sooner follow up if symptoms do not improve or worsen or seek emergency care

## 2016-01-27 NOTE — Progress Notes (Signed)
Subjective:    Patient ID: Erin [name], female    DOB: 1981-04-23, 35 y.o.   MRN: [id number]  HPI 35 yo female never smoker followed for asthma and AR.  She is Geophysicist/field seismologist principal at middle school.   01/25/16 Follow up :  Pt returns for with persistent sinus symptoms. She was seen 2 week ago with acute sinusitis and asthma flare , tx w/ zpack, depo medrol shot, ,neb tx , and hydromet cough syrup.  . She says she does not feel much better, continues to have sinus pain/pressure, drainage and cough with thick clear mucus. Has occasional wheezing.   Denies any chest congestion/tightness, fever, nausea or vomiting.  Says menses are regular, denies pregnancy .    Past Medical History  Diagnosis Date  . Asthma   . Hypertension   . Abortion history     x3  . Elevated blood pressure    Current Outpatient Prescriptions on File Prior to Visit  Medication Sig Dispense Refill  . albuterol (VENTOLIN HFA) 108 (90 Base) MCG/ACT inhaler Inhale 2 puffs into the lungs every 6 (six) hours as needed. 1 Inhaler 3  . ALPRAZolam (XANAX) 0.25 MG tablet Take 1 tablet (0.25 mg total) by mouth 2 (two) times daily as needed for anxiety. 30 tablet 0  . Azelastine-Fluticasone 137-50 MCG/ACT SUSP Place 2 sprays into the nose at bedtime. 23 g 2  . Cyanocobalamin (VITAMIN B-12) 2500 MCG SUBL Place 1 tablet under the tongue daily.      Marland Kitchen HYDROcodone-homatropine (HYCODAN) 5-1.5 MG/5ML syrup Take 5 mLs by mouth every 6 (six) hours as needed for cough. 240 mL 0  . Multiple Vitamin (MULTIVITAMIN) tablet Take 1 tablet by mouth daily.      Marland Kitchen SYNTHROID 137 MCG tablet take 1 tablet by mouth once daily BEFORE BREAKFAST 30 tablet 5  . cetirizine (ZYRTEC) 10 MG tablet Take 1 tablet (10 mg total) by mouth daily. (Patient not taking: Reported on 01/25/2016) 30 tablet 11   No current facility-administered medications on file prior to visit.    Review of Systems Constitutional:   No  weight loss, night sweats,  Fevers, chills,  fatigue, or  lassitude.  HEENT:   No headaches,  Difficulty swallowing,  Tooth/dental problems, or  Sore throat,                No sneezing, itching, ear ache,  +nasal congestion, post nasal drip,   CV:  No chest pain,  Orthopnea, PND, swelling in lower extremities, anasarca, dizziness, palpitations, syncope.   GI  No heartburn, indigestion, abdominal pain, nausea, vomiting, diarrhea, change in bowel habits, loss of appetite, bloody stools.   Resp:   No chest wall deformity  Skin: no rash or lesions.  GU: no dysuria, change in color of urine, no urgency or frequency.  No flank pain, no hematuria   MS:  No joint pain or swelling.  No decreased range of motion.  No back pain.  Psych:  No change in mood or affect. No depression or anxiety.  No memory loss.          Objective:   Physical Exam Filed Vitals:   01/25/16 1604  BP: 136/88  Pulse: 82  Temp: 98.2 F (36.8 C)  TempSrc: Oral  Height:  (1.727 m)  Weight: 198 lb (89.812 kg)  SpO2: 99%   GEN: A/Ox3; pleasant , NAD, well nourished   HEENT:  [place]/AT,  EACs-clear, TMs-wnl, NOSE-clear mucus , THROAT-clear, no lesions, no  postnasal drip or exudate noted. Maxillary sinus tenderness   NECK:  Supple w/ fair ROM; no JVD; normal carotid impulses w/o bruits; no thyromegaly or nodules palpated; no lymphadenopathy.  RESP  Clear  P & A; w/o, wheezes/ rales/ or rhonchi.no accessory muscle use, no dullness to percussion  CARD:  RRR, no m/r/g  , no peripheral edema, pulses intact, no cyanosis or clubbing.  GI:   Soft & nt; nml bowel sounds; no organomegaly or masses detected.  Musco: Warm bil, no deformities or joint swelling noted.   Neuro: alert, no focal deficits noted.    Skin: Warm, no lesions or rashes  Tammy Parrett NP-C   Pulmonary and Critical Care  01/25/16       Assessment & Plan:

## 2016-03-30 ENCOUNTER — Encounter: Payer: Self-pay | Admitting: Adult Health

## 2016-03-30 ENCOUNTER — Ambulatory Visit (INDEPENDENT_AMBULATORY_CARE_PROVIDER_SITE_OTHER): Payer: BC Managed Care – PPO | Admitting: Adult Health

## 2016-03-30 VITALS — BP 116/80 | HR 88 | Temp 98.3°F | Ht 68.0 in | Wt 192.0 lb

## 2016-03-30 DIAGNOSIS — J4520 Mild intermittent asthma, uncomplicated: Secondary | ICD-10-CM | POA: Diagnosis not present

## 2016-03-30 MED ORDER — FLUCONAZOLE 150 MG PO TABS: 150.0000 mg | ORAL_TABLET | Freq: Every day | ORAL | Status: DC

## 2016-03-30 MED ORDER — AZITHROMYCIN 250 MG PO TABS
ORAL_TABLET | ORAL | Status: AC
Start: 2016-03-30 — End: 2016-04-04

## 2016-03-30 NOTE — Progress Notes (Signed)
Subjective:    Patient ID: Erin [name], female    DOB: 12/10/1980, 35 y.o.   MRN: [id number]  HPI 35 yo female never smoker followed for asthma and AR.  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   03/30/2016 Follow up ; Asthma /AR  Pt presents for 2 month follow up .  Was seen last ov for sick visit with sinusitis . Tx w/ Augmentin x 10 d . Says she got all better.  Says that she was on vacation last week and started getting a productive cough with thick, yellow-green mucus. Some nasal stuffiness. She says that her cough and congestion are getting worse. Has not I been using her Dymista nasal spray or saline as much lately.  Denies any chest pain, orthopnea, fever, nausea or vomiting.  CXR last ov was clear.     Past Medical History  Diagnosis Date  . Asthma   . Hypertension   . Abortion history     x3  . Elevated blood pressure    Current Outpatient Prescriptions on File Prior to Visit  Medication Sig Dispense Refill  . albuterol (VENTOLIN HFA) 108 (90 Base) MCG/ACT inhaler Inhale 2 puffs into the lungs every 6 (six) hours as needed. 1 Inhaler 3  . ALPRAZolam (XANAX) 0.25 MG tablet Take 1 tablet (0.25 mg total) by mouth 2 (two) times daily as needed for anxiety. 30 tablet 0  . Azelastine-Fluticasone 137-50 MCG/ACT SUSP Place 2 sprays into the nose at bedtime. 23 g 2  . cetirizine (ZYRTEC) 10 MG tablet Take 1 tablet (10 mg total) by mouth daily. 30 tablet 11  . Cyanocobalamin (VITAMIN B-12) 2500 MCG SUBL Place 1 tablet under the tongue daily.      Marland Kitchen. HYDROcodone-homatropine (HYCODAN) 5-1.5 MG/5ML syrup Take 5 mLs by mouth every 6 (six) hours as needed for cough. 240 mL 0  . Multiple Vitamin (MULTIVITAMIN) tablet Take 1 tablet by mouth daily.      Marland Kitchen. SYNTHROID 137 MCG tablet take 1 tablet by mouth once daily BEFORE BREAKFAST 30 tablet 5   No current facility-administered medications on file prior to visit.    Review of Systems Constitutional:   No  weight loss, night  sweats,  Fevers, chills, fatigue, or  lassitude.  HEENT:   No headaches,  Difficulty swallowing,  Tooth/dental problems, or  Sore throat,                No sneezing, itching, ear ache,  +nasal congestion, post nasal drip,   CV:  No chest pain,  Orthopnea, PND, swelling in lower extremities, anasarca, dizziness, palpitations, syncope.   GI  No heartburn, indigestion, abdominal pain, nausea, vomiting, diarrhea, change in bowel habits, loss of appetite, bloody stools.   Resp:   No chest wall deformity  Skin: no rash or lesions.  GU: no dysuria, change in color of urine, no urgency or frequency.  No flank pain, no hematuria   MS:  No joint pain or swelling.  No decreased range of motion.  No back pain.  Psych:  No change in mood or affect. No depression or anxiety.  No memory loss.          Objective:   Physical Exam Filed Vitals:   03/30/16 1119  BP: 116/80  Pulse: 88  Temp: 98.3 F (36.8 C)  TempSrc: Oral  Height: 5\' 8"  (1.727 m)  Weight: 192 lb (87.091 kg)  SpO2: 97%   GEN: A/Ox3; pleasant , NAD, well nourished  HEENT:  [place]/AT,  EACs-clear, TMs-wnl, NOSE-clear mucus , THROAT-clear, no lesions, no postnasal drip or exudate noted.  NECK:  Supple w/ fair ROM; no JVD; normal carotid impulses w/o bruits; no thyromegaly or nodules palpated; no lymphadenopathy.  RESP  Clear  P & A; w/o, wheezes/ rales/ or rhonchi.no accessory muscle use, no dullness to percussion  CARD:  RRR, no m/r/g  , no peripheral edema, pulses intact, no cyanosis or clubbing.  GI:   Soft & nt; nml bowel sounds; no organomegaly or masses detected.  Musco: Warm bil, no deformities or joint swelling noted.   Neuro: alert, no focal deficits noted.    Skin: Warm, no lesions or rashes  Abbegayle Denault NP-C  [facility]  03/30/2016      Assessment & Plan:

## 2016-03-30 NOTE — Patient Instructions (Addendum)
Zpack take as directed.  Mucinex DM Twice daily  As needed  Cough/congestion .  Saline nasal rinses As needed   Continue on Dymista nasal daily .  Begin Probiotic daily while on antibiotic.  Diflucan to have on hold if develop yeast infection.  Follow up Dr. Maple HudsonYoung  In 3 months and As needed   Please contact office for sooner follow up if symptoms do not improve or worsen or seek emergency care

## 2016-03-30 NOTE — Assessment & Plan Note (Signed)
Recurrent flare with URI  Advised to use dymista, zyrtec and saline rinses on reg basis.  cxr last ov clear  If cont to flare , consider labs with IGE/RAST , CBC w/ diff on return   Plan  Zpack take as directed.  Mucinex DM Twice daily  As needed  Cough/congestion .  Saline nasal rinses As needed   Continue on Dymista nasal daily .  Begin Probiotic daily while on antibiotic.  Diflucan to have on hold if develop yeast infection.  Follow up Dr. Maple HudsonYoung  In 3 months and As needed   Please contact office for sooner follow up if symptoms do not improve or worsen or seek emergency care

## 2016-04-07 ENCOUNTER — Ambulatory Visit: Payer: BC Managed Care – PPO | Admitting: Internal Medicine

## 2016-05-26 ENCOUNTER — Emergency Department (HOSPITAL_BASED_OUTPATIENT_CLINIC_OR_DEPARTMENT_OTHER)
Admission: EM | Admit: 2016-05-26 | Discharge: 2016-05-26 | Disposition: A | Discharge status: Home / Self Care | Payer: BC Managed Care – PPO | Attending: Emergency Medicine | Admitting: Emergency Medicine

## 2016-05-26 ENCOUNTER — Encounter (HOSPITAL_BASED_OUTPATIENT_CLINIC_OR_DEPARTMENT_OTHER): Payer: Self-pay | Admitting: *Deleted

## 2016-05-26 DIAGNOSIS — J45909 Unspecified asthma, uncomplicated: Secondary | ICD-10-CM | POA: Insufficient documentation

## 2016-05-26 DIAGNOSIS — I10 Essential (primary) hypertension: Secondary | ICD-10-CM | POA: Insufficient documentation

## 2016-05-26 DIAGNOSIS — M542 Cervicalgia: Secondary | ICD-10-CM | POA: Diagnosis not present

## 2016-05-26 MED ORDER — METHOCARBAMOL 500 MG PO TABS
500.0000 mg | ORAL_TABLET | Freq: Two times a day (BID) | ORAL | 0 refills | Status: DC
Start: 2016-05-26 — End: 2016-10-27

## 2016-05-26 MED ORDER — NAPROXEN 500 MG PO TABS: 500.0000 mg | ORAL_TABLET | Freq: Two times a day (BID) | ORAL | 0 refills | Status: DC

## 2016-05-26 MED FILL — METHOCARBAMOL 500 MG TABLET: 500 | 10 days supply | Qty: 20 | Fill #0

## 2016-05-26 MED FILL — NAPROXEN 500 MG TABLET: 500 | 10 days supply | Qty: 20 | Fill #0

## 2016-05-26 NOTE — ED Notes (Addendum)
NP at bedside.

## 2016-05-26 NOTE — ED Provider Notes (Signed)
[facility] Provider Note   CSN: [id number] Arrival date & time: 05/26/16  1605  By signing my name below, I, Erin [name], attest that this documentation has been prepared under the direction and in the presence of Erin Morn, NP. Electronically Signed: Angelene [name], ED Scribe. 05/26/16. 4:54 PM.    History   Chief Complaint Chief Complaint  Patient presents with  . Neck Pain    HPI Comments: Erin [name] is a 35 y.o. female who presents to the Emergency Department complaining of gradually worsening moderate bilateral posterior neck pain (worse on left) onset 2 days ago. She reports associated pain with ROM of neck. She denies any recent falls, injuries, or trauma to neck. No alleviating factors noted. Pt has not tried any medications PTA. She reports an allergy to Sulfa. She denies any fever, chills, sore throat, headaches, or any open wounds.   The history is provided by the patient. No language interpreter was used.  Neck Pain   This is a new problem. The current episode started 2 days ago. The problem has been gradually worsening. The pain is associated with nothing. There has been no fever. The pain is present in the occipital region. The pain is moderate. The symptoms are aggravated by position. The pain is worse during the night. Pertinent negatives include no headaches. She has tried nothing for the symptoms.    Past Medical History:  Diagnosis Date  . Abortion history    x3  . Asthma   . Elevated blood pressure   . Hypertension     Patient Active Problem List   Diagnosis Date Noted  . Sinusitis, acute maxillary 10/29/2014  . Headache(784.0) 03/30/2013  . 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 Perennial allergic rhinitis with seasonal variation 04/01/2011  . POSTSURGICAL HYPOTHYROIDISM 05/28/2009  . Anxiety and depression 05/28/2009  . Asthma, mild intermittent, well-controlled 06/23/2008  . GOITER 03/07/2008  . ELEVATED BLOOD PRESSURE  WITHOUT DIAGNOSIS OF HYPERTENSION 01/21/2008    Past Surgical History:  Procedure Laterality Date  . BREAST SURGERY     Breast reduction  . INDUCED ABORTION     x3  . THYROIDECTOMY  10/30/08   [place]    OB History    Gravida Para Term Preterm AB Living   3 0 0 0 3 0   SAB TAB Ectopic Multiple Live Births   0 3 0 0         Home Medications    Prior to Admission medications   Medication Sig Start Date End Date Taking? Authorizing Provider  albuterol (VENTOLIN HFA) 108 (90 Base) MCG/ACT inhaler Inhale 2 puffs into the lungs every 6 (six) hours as needed. 01/11/16   Bevelyn Ngo, NP  ALPRAZolam Prudy Feeler) 0.25 MG tablet Take 1 tablet (0.25 mg total) by mouth 2 (two) times daily as needed for anxiety. 10/06/15   Lelon Perla Chase, DO  Azelastine-Fluticasone 137-50 MCG/ACT SUSP Place 2 sprays into the nose at bedtime. 01/03/14   Waymon Budge, MD  cetirizine (ZYRTEC) 10 MG tablet Take 1 tablet (10 mg total) by mouth daily. 10/07/13   Lelon Perla Chase, DO  Cyanocobalamin (VITAMIN B-12) 2500 MCG SUBL Place 1 tablet under the tongue daily.      Historical Provider, MD  fluconazole (DIFLUCAN) 150 MG tablet Take 1 tablet (150 mg total) by mouth daily. 03/30/16   Tammy S Parrett, NP  HYDROcodone-homatropine (HYCODAN) 5-1.5 MG/5ML syrup Take 5 mLs by mouth every 6 (six) hours as  needed for cough. 01/11/16   Bevelyn Ngo, NP  Multiple Vitamin (MULTIVITAMIN) tablet Take 1 tablet by mouth daily.      Historical Provider, MD  SYNTHROID 137 MCG tablet take 1 tablet by mouth once daily BEFORE BREAKFAST 01/19/16   Donato Schultz, DO    Family History Family History  Problem Relation Age of Onset  . Coronary artery disease Father   . Stroke Father   . Hypertension Mother   . Diabetes Maternal Aunt   . Lung cancer      great uncle    Social History Social History  Substance Use Topics  . Smoking status: Never Smoker  . Smokeless tobacco: Never Used  . Alcohol use 1.5 oz/week     3 Standard drinks or equivalent per week     Allergies   Shellfish allergy and Sulfonamide derivatives   Review of Systems Review of Systems  Constitutional: Negative for chills and fever.  HENT: Negative for sore throat.   Musculoskeletal: Positive for neck pain.  Skin: Negative for wound.  Neurological: Negative for headaches.  All other systems reviewed and are negative.    Physical Exam Updated Vital Signs BP 130/89   Pulse 83   Temp 98.9 F (37.2 C)   Resp 16   Ht 5\' 8"  (1.727 m)   Wt 190 lb (86.2 kg)   LMP 05/06/2016   SpO2 100%   BMI 28.89 kg/m   Physical Exam  Constitutional: She is oriented to person, place, and time. She appears well-developed and well-nourished. No distress.  HENT:  Head: Normocephalic and atraumatic.  Eyes: Conjunctivae and EOM are normal.  Neck: Neck supple. Muscular tenderness present. No tracheal deviation present.  Muscular tenderness to neck Increased pain with rotation to sides. No increased pain with neck to chest.   Cardiovascular: Normal rate.   Pulmonary/Chest: Effort normal. No respiratory distress.  Musculoskeletal: Normal range of motion.  Neurological: She is alert and oriented to person, place, and time.  Skin: Skin is warm and dry.  Psychiatric: She has a normal mood and affect. Her behavior is normal.  Nursing note and vitals reviewed.    ED Treatments / Results  DIAGNOSTIC STUDIES: Oxygen Saturation is 100% on RA, normal by my interpretation.    COORDINATION OF CARE: 4:52 PM- Pt advised of plan for treatment and pt agrees. Pt will receive muscle relaxer and pain medication for pain relief. Advised to follow up with PCP.    Procedures Procedures (including critical care time)  Medications Ordered in ED Medications - No data to display   Initial Impression / Assessment and Plan / ED Course  Erin Morn, NP has reviewed the triage vital signs and the nursing notes.  Pertinent labs & imaging results that  were available during my care of the patient were reviewed by me and considered in my medical decision making (see chart for details).  Clinical Course  Patient with muscular neck pain. No fever. Presentation not concerning for meningitis. Conservative treatment with anti-inflammatory and muscle relaxant. Return precautions discussed. Follow-up with PCP.    Final Clinical Impressions(s) / ED Diagnoses   Final diagnoses:  Neck pain    New Prescriptions Discharge Medication List as of 05/26/2016  5:11 PM    START taking these medications   Details  methocarbamol (ROBAXIN) 500 MG tablet Take 1 tablet (500 mg total) by mouth 2 (two) times daily., Starting Thu 05/26/2016, Print    naproxen (NAPROSYN) 500 MG tablet Take 1  tablet (500 mg total) by mouth 2 (two) times daily., Starting Thu 05/26/2016, Print       I personally performed the services described in this documentation, which was scribed in my presence. The recorded information has been reviewed and is accurate.    Erin Mornavid Leclaire, NP 05/27/16 0131    Vanetta MuldersScott Zackowski, MD 05/28/16 [phone]

## 2016-05-26 NOTE — ED Triage Notes (Signed)
Pt c/o left sided neck pain x 2 days

## 2016-07-28 ENCOUNTER — Encounter: Payer: Self-pay | Admitting: Adult Health

## 2016-07-28 ENCOUNTER — Ambulatory Visit (INDEPENDENT_AMBULATORY_CARE_PROVIDER_SITE_OTHER): Payer: BC Managed Care – PPO | Admitting: Adult Health

## 2016-07-28 DIAGNOSIS — J069 Acute upper respiratory infection, unspecified: Secondary | ICD-10-CM | POA: Diagnosis not present

## 2016-07-28 MED ORDER — AZITHROMYCIN 250 MG PO TABS: ORAL_TABLET | ORAL | 0 refills | Status: AC

## 2016-07-28 NOTE — Patient Instructions (Addendum)
Zpack take as directed.  Zyrtec 10mg  At bedtime  As needed  Drainage .  Mucinex DM Twice daily  As needed  Cough/congestion .  Saline nasal rinses As needed   Continue on Dymista nasal daily .  Begin Probiotic daily while on antibiotic.  Follow up Dr. Maple HudsonYoung  In 3 months and As needed   Please contact office for sooner follow up if symptoms do not improve or worsen or seek emergency care

## 2016-07-28 NOTE — Assessment & Plan Note (Signed)
URI vs early sinusitis   Plan  Patient Instructions  Zpack take as directed.  Zyrtec 10mg  At bedtime  As needed  Drainage .  Mucinex DM Twice daily  As needed  Cough/congestion .  Saline nasal rinses As needed   Continue on Dymista nasal daily .  Begin Probiotic daily while on antibiotic.  Follow up Dr. Maple HudsonYoung  In 3 months and As needed   Please contact office for sooner follow up if symptoms do not improve or worsen or seek emergency care

## 2016-07-28 NOTE — Progress Notes (Signed)
Subjective:    Patient ID: Erin [name], female    DOB: 08/27/1981, 35 y.o.   MRN: [id number]  HPI 35 yo female never smoker followed for asthma and AR.  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   07/28/2016 Acute OV ; Asthma /AR  Pt presents for an acute office visit. Complains of 2 days of sinus pain and pressure, nasal congestion and stuffiness, ear fullness, post nasal drainage, headache  and minimal dry cough . Feels achy. No fever .   Denies any chest pain, orthopnea, fever, n/v/d, rash . No recent travel or abx.  Has been taking otc cold meds.  Left school early today .     Past Medical History:  Diagnosis Date  . Abortion history    x3  . Asthma   . Elevated blood pressure   . Hypertension    Current Outpatient Prescriptions on File Prior to Visit  Medication Sig Dispense Refill  . albuterol (VENTOLIN HFA) 108 (90 Base) MCG/ACT inhaler Inhale 2 puffs into the lungs every 6 (six) hours as needed. 1 Inhaler 3  . Azelastine-Fluticasone 137-50 MCG/ACT SUSP Place 2 sprays into the nose at bedtime. 23 g 2  . cetirizine (ZYRTEC) 10 MG tablet Take 1 tablet (10 mg total) by mouth daily. 30 tablet 11  . Cyanocobalamin (VITAMIN B-12) 2500 MCG SUBL Place 1 tablet under the tongue daily.      . Multiple Vitamin (MULTIVITAMIN) tablet Take 1 tablet by mouth daily.      Marland Kitchen. SYNTHROID 137 MCG tablet take 1 tablet by mouth once daily BEFORE BREAKFAST 30 tablet 5  . ALPRAZolam (XANAX) 0.25 MG tablet Take 1 tablet (0.25 mg total) by mouth 2 (two) times daily as needed for anxiety. (Patient not taking: Reported on 07/28/2016) 30 tablet 0  . methocarbamol (ROBAXIN) 500 MG tablet Take 1 tablet (500 mg total) by mouth 2 (two) times daily. (Patient not taking: Reported on 07/28/2016) 20 tablet 0  . naproxen (NAPROSYN) 500 MG tablet Take 1 tablet (500 mg total) by mouth 2 (two) times daily. (Patient not taking: Reported on 07/28/2016) 20 tablet 0   No current facility-administered medications  on file prior to visit.     Review of Systems Constitutional:   No  weight loss, night sweats,  Fevers, chills, fatigue, or  lassitude.  HEENT:   No headaches,  Difficulty swallowing,  Tooth/dental problems, or  Sore throat,                No sneezing, itching, ear ache,  +nasal congestion, post nasal drip,   CV:  No chest pain,  Orthopnea, PND, swelling in lower extremities, anasarca, dizziness, palpitations, syncope.   GI  No heartburn, indigestion, abdominal pain, nausea, vomiting, diarrhea, change in bowel habits, loss of appetite, bloody stools.   Resp:   No chest wall deformity  Skin: no rash or lesions.  GU: no dysuria, change in color of urine, no urgency or frequency.  No flank pain, no hematuria   MS:  No joint pain or swelling.  No decreased range of motion.  No back pain.  Psych:  No change in mood or affect. No depression or anxiety.  No memory loss.          Objective:   Physical Exam Vitals:   07/28/16 1613  BP: (!) 142/84  Pulse: 89  SpO2: 98%  Weight: 195 lb 9.6 oz (88.7 kg)  Height: 5\' 9"  (1.753 m)   GEN: A/Ox3; pleasant ,  NAD, well nourished    HEENT:  [place]/AT,  EACs-clear, TMs-wnl, NOSE-clear mucus, turbinate edema  , THROAT-clear, no lesions, no postnasal drip or exudate noted.  NECK:  Supple w/ fair ROM; no JVD; normal carotid impulses w/o bruits; no thyromegaly or nodules palpated; no lymphadenopathy.  No nuchal rigidity .   RESP  Clear  P & A; w/o, wheezes/ rales/ or rhonchi. no accessory muscle use, no dullness to percussion  CARD:  RRR, no m/r/g  , no peripheral edema, pulses intact, no cyanosis or clubbing.  GI:   Soft & nt; nml bowel sounds; no organomegaly or masses detected.   Musco: Warm bil, no deformities or joint swelling noted.   Neuro: alert, no focal deficits noted.    Skin: Warm, no lesions or rashes  Tammy Parrett NP-C  [facility]  07/28/2016      Assessment & Plan:

## 2016-08-24 ENCOUNTER — Other Ambulatory Visit: Payer: Self-pay | Admitting: Family Medicine

## 2016-09-20 ENCOUNTER — Telehealth: Payer: Self-pay | Admitting: Family Medicine

## 2016-09-20 NOTE — Telephone Encounter (Signed)
Caller name: Relationship to patient: Self Can be reached: [phone]  Reason for call: Patient request refill on SYNTHROID 137 MCG tablet  States she know she has not been seen but she needs her medication   Pharmacy: [id number]- [place], [place] - [address] [phone] (Phone) [phone] (Fax)

## 2016-09-21 NOTE — Telephone Encounter (Signed)
Patient is calling regarding this medication refill. She states that she has not taken any medication today as she is out. Patient has an appt schedule for 10/27/16. She would like to get refills to last her at least until the appointment. Please advise.

## 2016-09-22 NOTE — Telephone Encounter (Signed)
Advised patient that she has [facility] [address]

## 2016-09-22 NOTE — Telephone Encounter (Signed)
Pt called in to follow up on refill request. Explained to pt that pcp was out of the office yesterday but is here today. Pt would like to have refill completed today if possible.    CB: [phone]

## 2016-10-27 ENCOUNTER — Ambulatory Visit (INDEPENDENT_AMBULATORY_CARE_PROVIDER_SITE_OTHER): Payer: BC Managed Care – PPO | Admitting: Family Medicine

## 2016-10-27 ENCOUNTER — Encounter: Payer: Self-pay | Admitting: Family Medicine

## 2016-10-27 VITALS — BP 116/80 | HR 75 | Temp 98.2°F | Resp 16 | Ht 69.0 in | Wt 193.2 lb

## 2016-10-27 DIAGNOSIS — R5383 Other fatigue: Secondary | ICD-10-CM

## 2016-10-27 DIAGNOSIS — E039 Hypothyroidism, unspecified: Secondary | ICD-10-CM | POA: Diagnosis not present

## 2016-10-27 NOTE — Progress Notes (Signed)
Subjective:  I acted as a Neurosurgeon for Dr. Zola Button.  Erin [name], CMA   Patient ID: Erin [name], female    DOB: Jan 26, 1981, 36 y.o.   MRN: [id number]  Chief Complaint  Patient presents with  . Hypothyroidism    HPI Patient is in today for follow up hypothyroidism.  She complains of fatigue and thinks it maybe the thyroid but    Past Medical History:  Diagnosis Date  . Abortion history    x3  . Asthma   . Elevated blood pressure   . Hypertension     Past Surgical History:  Procedure Laterality Date  . BREAST SURGERY     Breast reduction  . INDUCED ABORTION     x3  . THYROIDECTOMY  10/30/08   [place]    Family History  Problem Relation Age of Onset  . Coronary artery disease Father   . Stroke Father   . Hypertension Mother   . Diabetes Maternal Aunt   . Lung cancer      great uncle    Social History   Social History  . Marital status: Single    Spouse name: n/a  . Number of children: 0  . Years of education: Master's   Occupational History  . 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 Smoking status: Never Smoker  . Smokeless tobacco: Never Used  . Alcohol use 1.5 oz/week    3 Standard drinks or equivalent per week  . Drug use: No  . Sexual activity: Yes    Partners: Male    Birth control/ protection: Condom   Other Topics Concern  . Not on file   Social History Narrative   Working on her doctoral degree.  Lives alone.    Outpatient Medications Prior to Visit  Medication Sig Dispense Refill  . albuterol (VENTOLIN HFA) 108 (90 Base) MCG/ACT inhaler Inhale 2 puffs into the lungs every 6 (six) hours as needed. 1 Inhaler 3  . Azelastine-Fluticasone 137-50 MCG/ACT SUSP Place 2 sprays into the nose at bedtime. 23 g 2  . cetirizine (ZYRTEC) 10 MG tablet Take 1 tablet (10 mg total) by mouth daily. 30 tablet 11  . Cyanocobalamin (VITAMIN B-12) 2500 MCG SUBL Place 1 tablet under the tongue daily.      . Multiple Vitamin  (MULTIVITAMIN) tablet Take 1 tablet by mouth daily.      Marland Kitchen SYNTHROID 137 MCG tablet TAKE 1 TABLET BEFORE BREAKFAST 30 tablet 4  . ALPRAZolam (XANAX) 0.25 MG tablet Take 1 tablet (0.25 mg total) by mouth 2 (two) times daily as needed for anxiety. (Patient not taking: Reported on 07/28/2016) 30 tablet 0  . methocarbamol (ROBAXIN) 500 MG tablet Take 1 tablet (500 mg total) by mouth 2 (two) times daily. (Patient not taking: Reported on 07/28/2016) 20 tablet 0  . naproxen (NAPROSYN) 500 MG tablet Take 1 tablet (500 mg total) by mouth 2 (two) times daily. (Patient not taking: Reported on 07/28/2016) 20 tablet 0   No facility-administered medications prior to visit.     Allergies  Allergen Reactions  . Shellfish Allergy Anaphylaxis  . Sulfonamide Derivatives     Review of Systems  Constitutional: Negative for chills, fever and malaise/fatigue.  HENT: Negative for congestion and hearing loss.   Eyes: Negative for discharge.  Respiratory: Negative for cough, sputum production and shortness of breath.   Cardiovascular: Negative for chest pain, palpitations and leg swelling.  Gastrointestinal: Negative for abdominal pain, blood in stool,  constipation, diarrhea, heartburn, nausea and vomiting.  Genitourinary: Negative for dysuria, frequency, hematuria and urgency.  Musculoskeletal: Negative for back pain, falls and myalgias.  Skin: Negative for rash.  Neurological: Negative for dizziness, sensory change, loss of consciousness, weakness and headaches.  Endo/Heme/Allergies: Negative for environmental allergies. Does not bruise/bleed easily.  Psychiatric/Behavioral: Negative for depression and suicidal ideas. The patient is not nervous/anxious and does not have insomnia.        Objective:    Physical Exam  Constitutional: She is oriented to person, place, and time. She appears well-developed and well-nourished.  HENT:  Head: Normocephalic and atraumatic.  Eyes: Conjunctivae and EOM are normal.    Neck: Normal range of motion. Neck supple. No JVD present. Carotid bruit is not present. No thyromegaly present.  Cardiovascular: Normal rate, regular rhythm and normal heart sounds.   No murmur heard. Pulmonary/Chest: Effort normal and breath sounds normal. No respiratory distress. She has no wheezes. She has no rales. She exhibits no tenderness.  Musculoskeletal: She exhibits no edema.  Neurological: She is alert and oriented to person, place, and time.  Psychiatric: She has a normal mood and affect.  Nursing note and vitals reviewed.   BP 116/80 (BP Location: Left Arm, Cuff Size: Normal)   Pulse 75   Temp 98.2 F (36.8 C) (Oral)   Resp 16   Ht 5\' 9"  (1.753 m)   Wt 193 lb 3.2 oz (87.6 kg)   LMP 10/10/2016   SpO2 98%   BMI 28.53 kg/m  Wt Readings from Last 3 Encounters:  10/27/16 193 lb 3.2 oz (87.6 kg)  07/28/16 195 lb 9.6 oz (88.7 kg)  05/26/16 190 lb (86.2 kg)     Lab Results  Component Value Date   WBC 8.7 12/31/2015   HGB 13.2 12/31/2015   HCT 39.3 12/31/2015   PLT 159.0 12/31/2015   GLUCOSE 83 12/31/2015   ALT 12 12/31/2015   AST 18 12/31/2015   NA 140 12/31/2015   K 4.1 12/31/2015   CL 104 12/31/2015   CREATININE 1.03 12/31/2015   BUN 19 12/31/2015   CO2 28 12/31/2015   TSH 2.47 10/27/2016    Lab Results  Component Value Date   TSH 2.47 10/27/2016   Lab Results  Component Value Date   WBC 8.7 12/31/2015   HGB 13.2 12/31/2015   HCT 39.3 12/31/2015   MCV 87.9 12/31/2015   PLT 159.0 12/31/2015   Lab Results  Component Value Date   NA 140 12/31/2015   K 4.1 12/31/2015   CO2 28 12/31/2015   GLUCOSE 83 12/31/2015   BUN 19 12/31/2015   CREATININE 1.03 12/31/2015   BILITOT 0.4 12/31/2015   ALKPHOS 49 12/31/2015   AST 18 12/31/2015   ALT 12 12/31/2015   PROT 7.2 12/31/2015   ALBUMIN 4.3 12/31/2015   CALCIUM 9.4 12/31/2015   GFR 77.74 12/31/2015   No results found for: CHOL No results found for: HDL No results found for: LDLCALC No results  found for: TRIG No results found for: CHOLHDL No results found for: [id number]     Assessment & Plan:   Problem List Items Addressed This Visit    None    Visit Diagnoses    Hypothyroidism, unspecified type    -  Primary   Relevant Orders   Thyroid Panel With TSH (Completed)   VITAMIN D 25 Hydroxy (Vit-D Deficiency, Fractures) (Completed)   Vitamin B12 (Completed)   Fatigue, unspecified type       Relevant  Orders   VITAMIN D 25 Hydroxy (Vit-D Deficiency, Fractures) (Completed)   Vitamin B12 (Completed)    check labs con't synthroid  I have discontinued Ms. Finan's ALPRAZolam, methocarbamol, and naproxen. I am also having her maintain her multivitamin, Vitamin B-12, cetirizine, Azelastine-Fluticasone, albuterol, and SYNTHROID.  No orders of the defined types were placed in this encounter.   CMA served as Neurosurgeon during this visit. History, Physical and Plan performed by medical provider. Documentation and orders reviewed and attested to.  Donato Schultz, DO

## 2016-10-27 NOTE — Patient Instructions (Signed)
Fatigue Introduction Fatigue is feeling tired all of the time, a lack of energy, or a lack of motivation. Occasional or mild fatigue is often a normal response to activity or life in general. However, long-lasting (chronic) or extreme fatigue may indicate an underlying medical condition. Follow these instructions at home: Watch your fatigue for any changes. The following actions may help to lessen any discomfort you are feeling:  Talk to your health care provider about how much sleep you need each night. Try to get the required amount every night.  Take medicines only as directed by your health care provider.  Eat a healthy and nutritious diet. Ask your health care provider if you need help changing your diet.  Drink enough fluid to keep your urine clear or pale yellow.  Practice ways of relaxing, such as yoga, meditation, massage therapy, or acupuncture.  Exercise regularly.  Change situations that cause you stress. Try to keep your work and personal routine reasonable.  Do not abuse illegal drugs.  Limit alcohol intake to no more than 1 drink per day for nonpregnant women and 2 drinks per day for men. One drink equals 12 ounces of beer, 5 ounces of wine, or 1 ounces of hard liquor.  Take a multivitamin, if directed by your health care provider. Contact a health care provider if:  Your fatigue does not get better.  You have a fever.  You have unintentional weight loss or gain.  You have headaches.  You have difficulty:  Falling asleep.  Sleeping throughout the night.  You feel angry, guilty, anxious, or sad.  You are unable to have a bowel movement (constipation).  You skin is dry.  Your legs or another part of your body is swollen. Get help right away if:  You feel confused.  Your vision is blurry.  You feel faint or pass out.  You have a severe headache.  You have severe abdominal, pelvic, or back pain.  You have chest pain, shortness of breath, or an  irregular or fast heartbeat.  You are unable to urinate or you urinate less than normal.  You develop abnormal bleeding, such as bleeding from the rectum, vagina, nose, lungs, or nipples.  You vomit blood.  You have thoughts about harming yourself or committing suicide.  You are worried that you might harm someone else. This information is not intended to replace advice given to you by your health care provider. Make sure you discuss any questions you have with your health care provider. Document Released: 06/26/2007 Document Revised: 02/04/2016 Document Reviewed: 12/31/2013  2017 Elsevier  

## 2016-10-27 NOTE — Progress Notes (Signed)
Pre visit review using our clinic review tool, if applicable. No additional management support is needed unless otherwise documented below in the visit note. 

## 2016-10-28 LAB — VITAMIN B12: VITAMIN B 12: 706 pg/mL (ref 211–911)

## 2016-10-28 LAB — VITAMIN D 25 HYDROXY (VIT D DEFICIENCY, FRACTURES): VITD: 18.15 ng/mL — AB (ref 30.00–100.00)

## 2016-10-29 LAB — THYROID PANEL WITH TSH
FREE THYROXINE INDEX: 2.7 (ref 1.4–3.8)
T3 UPTAKE: 33 % (ref 22–35)
T4 TOTAL: 8.2 ug/dL (ref 4.5–12.0)
TSH: 2.47 mIU/L

## 2016-10-31 ENCOUNTER — Other Ambulatory Visit: Payer: Self-pay | Admitting: Family Medicine

## 2016-10-31 DIAGNOSIS — E559 Vitamin D deficiency, unspecified: Secondary | ICD-10-CM

## 2016-10-31 MED ORDER — VITAMIN D (ERGOCALCIFEROL) 1.25 MG (50000 UNIT) PO CAPS: 50000.0000 [IU] | ORAL_CAPSULE | ORAL | 3 refills | Status: DC

## 2017-01-23 ENCOUNTER — Other Ambulatory Visit (INDEPENDENT_AMBULATORY_CARE_PROVIDER_SITE_OTHER): Payer: BC Managed Care – PPO

## 2017-01-23 DIAGNOSIS — E559 Vitamin D deficiency, unspecified: Secondary | ICD-10-CM | POA: Diagnosis not present

## 2017-01-24 LAB — VITAMIN D 25 HYDROXY (VIT D DEFICIENCY, FRACTURES): VITD: 34.5 ng/mL (ref 30.00–100.00)

## 2017-03-08 ENCOUNTER — Other Ambulatory Visit: Payer: Self-pay | Admitting: Family Medicine

## 2017-03-10 ENCOUNTER — Other Ambulatory Visit: Payer: Self-pay | Admitting: Family Medicine

## 2017-04-10 ENCOUNTER — Encounter: Payer: BC Managed Care – PPO | Admitting: Family Medicine

## 2017-05-07 ENCOUNTER — Other Ambulatory Visit: Payer: Self-pay | Admitting: Family Medicine

## 2017-06-07 ENCOUNTER — Ambulatory Visit: Payer: BC Managed Care – PPO | Admitting: Acute Care

## 2017-06-08 ENCOUNTER — Ambulatory Visit (INDEPENDENT_AMBULATORY_CARE_PROVIDER_SITE_OTHER): Payer: BC Managed Care – PPO | Admitting: Adult Health

## 2017-06-08 ENCOUNTER — Encounter: Payer: Self-pay | Admitting: Adult Health

## 2017-06-08 DIAGNOSIS — J4520 Mild intermittent asthma, uncomplicated: Secondary | ICD-10-CM

## 2017-06-08 DIAGNOSIS — J069 Acute upper respiratory infection, unspecified: Secondary | ICD-10-CM | POA: Diagnosis not present

## 2017-06-08 MED ORDER — AZITHROMYCIN 250 MG PO TABS: ORAL_TABLET | ORAL | 0 refills | Status: AC

## 2017-06-08 NOTE — Assessment & Plan Note (Signed)
URI /Early sinusitis - suspect is viral  Zpack to have on hold if sx worsen with discolored mucus   Plan  Patient Instructions  Zpack take as directed. - to have on hold if symptoms worsen with discolored mucus .  Zyrtec  At bedtime  As needed  Drainage .  Mucinex DM Twice daily  As needed  Cough/congestion .  Saline nasal rinses As needed   Dymista daily As needed  Nasal congestion  Follow up Dr. Maple Hudson  In 4  months and As needed   Please contact office for sooner follow up if symptoms do not improve or worsen or seek emergency care

## 2017-06-08 NOTE — Patient Instructions (Signed)
Zpack take as directed. - to have on hold if symptoms worsen with discolored mucus .  Zyrtec $RemoveB bedtime  As needed  Drainage .  Mucinex DM Twice daily  As needed  Cough/congestion .  Saline nasal rinses As needed   Dymista daily As needed  Nasal congestion  Follow up Dr. Maple Hudson  In 4  months and As needed   Please contact office for sooner follow up if symptoms do not improve or worsen or seek emergency care

## 2017-06-08 NOTE — Assessment & Plan Note (Signed)
No significant flare with URI  Cont on trigger control .

## 2017-06-08 NOTE — Progress Notes (Signed)
  ID: [place], female    DOB: 1980/12/08, 36 y.o.   MRN: [id number]  Chief Complaint  Patient presents with  . Acute Visit    sinus infection     Referring provider: Donato Schultz, *  HPI: 36 yo female never smoker followed for asthma and AR  She is an Geophysicist/field seismologist principal at middle school   06/08/2017 Acute OV : Sinus infection Pt Presents for an acute office visit. Patient complains over the last 4 days, that she's had sinus congestion, drainage and pressure. She's had nasal stuffiness, postnasal drip and cough. She denies any wheezing or chest pain. No fever, nausea, vomiting or orthopnea. No recent travel, antibiotic use. She's been using over-the-counter sinus medications without much relief.  She denies wheezing . No albuterol use.   Allergies  Allergen Reactions  . Shellfish Allergy Anaphylaxis  . Sulfonamide Derivatives     Immunization History  Administered Date(s) Administered  . Influenza Split 07/03/2012, 06/11/2016  . Influenza,inj,Quad PF,6+ Mos 06/18/2013, 07/14/2014  . Td 09/12/2000    Past Medical History:  Diagnosis Date  . Abortion history    x3  . Asthma   . Elevated blood pressure   . Hypertension     Tobacco History: History  Smoking Status  . Never Smoker  Smokeless Tobacco  . Never Used   Counseling given: Not Answered   Outpatient Encounter Prescriptions as of 06/08/2017  Medication Sig  . albuterol (VENTOLIN HFA) 108 (90 Base) MCG/ACT inhaler Inhale 2 puffs into the lungs every 6 (six) hours as needed.  . Azelastine-Fluticasone 137-50 MCG/ACT SUSP Place 2 sprays into the nose at bedtime.  . cetirizine (ZYRTEC) 10 MG tablet Take 1 tablet (10 mg total) by mouth daily.  . Cyanocobalamin (VITAMIN B-12) 2500 MCG SUBL Place 1 tablet under the tongue daily.    . Multiple Vitamin (MULTIVITAMIN) tablet Take 1 tablet by mouth daily.    Marland Kitchen SYNTHROID 137 MCG tablet Take 1 tablet (137 mcg total) by mouth daily before breakfast.   . Vitamin D, Ergocalciferol, (DRISDOL) 50000 units CAPS capsule Take 1 capsule (50,000 Units total) by mouth every 7 (seven) days.  Marland Kitchen azithromycin (ZITHROMAX Z-PAK) 250 MG tablet Take 2 tablets (500 mg) on  Day 1,  followed by 1 tablet (250 mg) once daily on Days 2 through 5.   No facility-administered encounter medications on file as of 06/08/2017.      Review of Systems  Constitutional:   No  weight loss, night sweats,  Fevers, chills, fatigue, or  lassitude.  HEENT:   No headaches,  Difficulty swallowing,  Tooth/dental problems, or  Sore throat,                No sneezing, itching, ear ache,  +nasal congestion, post nasal drip,   CV:  No chest pain,  Orthopnea, PND, swelling in lower extremities, anasarca, dizziness, palpitations, syncope.   GI  No heartburn, indigestion, abdominal pain, nausea, vomiting, diarrhea, change in bowel habits, loss of appetite, bloody stools.   Resp: No shortness of breath with exertion or at rest.  No excess mucus, no productive cough,  No non-productive cough,  No coughing up of blood.  No change in color of mucus.  No wheezing.  No chest wall deformity  Skin: no rash or lesions.  GU: no dysuria, change in color of urine, no urgency or frequency.  No flank pain, no hematuria   MS:  No joint pain or swelling.  No  decreased range of motion.  No back pain.    Physical Exam  BP 124/74 (BP Location: Left Arm, Cuff Size: Normal)   Pulse 70   Ht  (1.753 m)   Wt 194 lb 9.6 oz (88.3 kg)   SpO2 100%   BMI 28.74 kg/m   GEN: A/Ox3; pleasant , NAD, well nourished    HEENT:  [place]/AT,  EACs-clear, TMs-wnl, NOSE-clear drainage  THROAT-clear, no lesions, no postnasal drip or exudate noted.   NECK:  Supple w/ fair ROM; no JVD; normal carotid impulses w/o bruits; no thyromegaly or nodules palpated; no lymphadenopathy.    RESP  Clear  P & A; w/o, wheezes/ rales/ or rhonchi. no accessory muscle use, no dullness to percussion  CARD:  RRR, no m/r/g, no  peripheral edema, pulses intact, no cyanosis or clubbing.  GI:   Soft & nt; nml bowel sounds; no organomegaly or masses detected.   Musco: Warm bil, no deformities or joint swelling noted.   Neuro: alert, no focal deficits noted.    Skin: Warm, no lesions or rashes    Lab Results:  CBC  BMET  BNP No results found for: BNP  ProBNP No results found for: PROBNP  Imaging: No results found.   Assessment & Plan:   URI (upper respiratory infection) URI /Early sinusitis - suspect is viral  Zpack to have on hold if sx worsen with discolored mucus   Plan  Patient Instructions  Zpack take as directed. - to have on hold if symptoms worsen with discolored mucus .  Zyrtec  At bedtime  As needed  Drainage .  Mucinex DM Twice daily  As needed  Cough/congestion .  Saline nasal rinses As needed   Dymista daily As needed  Nasal congestion  Follow up Dr. Maple Hudson  In 4  months and As needed   Please contact office for sooner follow up if symptoms do not improve or worsen or seek emergency care      Asthma, mild intermittent, well-controlled No significant flare with URI  Cont on trigger control .      Rubye Oaks, NP 06/08/2017

## 2017-09-14 ENCOUNTER — Other Ambulatory Visit: Payer: Self-pay

## 2017-09-14 MED ORDER — VITAMIN D (ERGOCALCIFEROL) 1.25 MG (50000 UNIT) PO CAPS: 50000.0000 [IU] | ORAL_CAPSULE | ORAL | 3 refills | Status: DC

## 2017-10-02 ENCOUNTER — Ambulatory Visit: Payer: BC Managed Care – PPO | Admitting: Internal Medicine

## 2017-10-09 ENCOUNTER — Ambulatory Visit: Payer: BC Managed Care – PPO | Admitting: Internal Medicine

## 2017-10-16 ENCOUNTER — Encounter: Payer: Self-pay | Admitting: Internal Medicine

## 2017-10-16 ENCOUNTER — Ambulatory Visit: Payer: BC Managed Care – PPO | Admitting: Internal Medicine

## 2017-10-16 VITALS — BP 130/80 | HR 71 | Temp 98.4°F | Ht 69.0 in | Wt 201.2 lb

## 2017-10-16 DIAGNOSIS — A084 Viral intestinal infection, unspecified: Secondary | ICD-10-CM | POA: Insufficient documentation

## 2017-10-16 MED ORDER — PROMETHAZINE HCL 12.5 MG PO TABS: 12.5000 mg | ORAL_TABLET | Freq: Four times a day (QID) | ORAL | 0 refills | Status: DC | PRN

## 2017-10-16 NOTE — Patient Instructions (Signed)
Viral Gastroenteritis, Adult  Viral gastroenteritis is also known as the stomach flu. This condition is caused by various viruses. These viruses can be passed from person to person very easily (are very contagious). This condition may affect your stomach, small intestine, and large intestine. It can cause sudden watery diarrhea, fever, and vomiting.  Diarrhea and vomiting can make you feel weak and cause you to become dehydrated. You may not be able to keep fluids down. Dehydration can make you tired and thirsty, cause you to have a dry mouth, and decrease how often you urinate. Older adults and people with other diseases or a weak immune system are at higher risk for dehydration.  It is important to replace the fluids that you lose from diarrhea and vomiting. If you become severely dehydrated, you may need to get fluids through an IV tube.  What are the causes?  Gastroenteritis is caused by various viruses, including rotavirus and norovirus. Norovirus is the most common cause in adults.  You can get sick by eating food, drinking water, or touching a surface contaminated with one of these viruses. You can also get sick from sharing utensils or other personal items with an infected person.  What increases the risk?  This condition is more likely to develop in people:  · Who have a weak defense system (immune system).  · Who live with one or more children who are younger than 2 years old.  · Who live in a nursing home.  · Who go on cruise ships.    What are the signs or symptoms?  Symptoms of this condition start suddenly 1-2 days after exposure to a virus. Symptoms may last a few days or as long as a week. The most common symptoms are watery diarrhea and vomiting. Other symptoms include:  · Fever.  · Headache.  · Fatigue.  · Pain in the abdomen.  · Chills.  · Weakness.  · Nausea.  · Muscle aches.  · Loss of appetite.    How is this diagnosed?  This condition is diagnosed with a medical history and physical exam. You  may also have a stool test to check for viruses or other infections.  How is this treated?  This condition typically goes away on its own. The focus of treatment is to restore lost fluids (rehydration). Your health care provider may recommend that you take an oral rehydration solution (ORS) to replace important salts and minerals (electrolytes) in your body. Severe cases of this condition may require giving fluids through an IV tube.  Treatment may also include medicine to help with your symptoms.  Follow these instructions at home:  Follow instructions from your health care provider about how to care for yourself at home.  Eating and drinking  Follow these recommendations as told by your health care provider:  · Take an ORS. This is a drink that is sold at pharmacies and retail stores.  · Drink clear fluids in small amounts as you are able. Clear fluids include water, ice chips, diluted fruit juice, and low-calorie sports drinks.  · Eat bland, easy-to-digest foods in small amounts as you are able. These foods include bananas, applesauce, rice, lean meats, toast, and crackers.  · Avoid fluids that contain a lot of sugar or caffeine, such as energy drinks, sports drinks, and soda.  · Avoid alcohol.  · Avoid spicy or fatty foods.    General instructions    · Drink enough fluid to keep your urine clear or   pale yellow.  · Wash your hands often. If soap and water are not available, use hand sanitizer.  · Make sure that all people in your household wash their hands well and often.  · Take over-the-counter and prescription medicines only as told by your health care provider.  · Rest at home while you recover.  · Watch your condition for any changes.  · Take a warm bath to relieve any burning or pain from frequent diarrhea episodes.  · Keep all follow-up visits as told by your health care provider. This is important.  Contact a health care provider if:  · You cannot keep fluids down.  · Your symptoms get worse.  · You have  new symptoms.  · You feel light-headed or dizzy.  · You have muscle cramps.  Get help right away if:  · You have chest pain.  · You feel extremely weak or you faint.  · You see blood in your vomit.  · Your vomit looks like coffee grounds.  · You have bloody or black stools or stools that look like tar.  · You have a severe headache, a stiff neck, or both.  · You have a rash.  · You have severe pain, cramping, or bloating in your abdomen.  · You have trouble breathing or you are breathing very quickly.  · Your heart is beating very quickly.  · Your skin feels cold and clammy.  · You feel confused.  · You have pain when you urinate.  · You have signs of dehydration, such as:  ? Dark urine, very little urine, or no urine.  ? Cracked lips.  ? Dry mouth.  ? Sunken eyes.  ? Sleepiness.  ? Weakness.  This information is not intended to replace advice given to you by your health care provider. Make sure you discuss any questions you have with your health care provider.  Document Released: 08/29/2005 Document Revised: 02/10/2016 Document Reviewed: 05/05/2015  Elsevier Interactive Patient Education © 2018 [employer]

## 2017-10-16 NOTE — Progress Notes (Signed)
Subjective:  Patient ID: Erin [name], female    DOB: 11/08/1980  Age: 37 y.o. MRN: [id number]  CC: Diarrhea   HPI Erin [name] presents for a one day history of watery stools, nausea, and vomiting.  She has kept down tea and a protein shake today.  She also complains of fatigue, muscle aches, and chills.  She has had a few abdominal cramps but denies vomiting, mucus in her stools, or bloody stools.  Outpatient Medications Prior to Visit  Medication Sig Dispense Refill  . albuterol (VENTOLIN HFA) 108 (90 Base) MCG/ACT inhaler Inhale 2 puffs into the lungs every 6 (six) hours as needed. 1 Inhaler 3  . Azelastine-Fluticasone 137-50 MCG/ACT SUSP Place 2 sprays into the nose at bedtime. 23 g 2  . cetirizine (ZYRTEC) 10 MG tablet Take 1 tablet (10 mg total) by mouth daily. 30 tablet 11  . Cyanocobalamin (VITAMIN B-12) 2500 MCG SUBL Place 1 tablet under the tongue daily.      . Multiple Vitamin (MULTIVITAMIN) tablet Take 1 tablet by mouth daily.      Marland Kitchen. SYNTHROID 137 MCG tablet Take 1 tablet (137 mcg total) by mouth daily before breakfast. 30 tablet 0  . Vitamin D, Ergocalciferol, (DRISDOL) 50000 units CAPS capsule Take 1 capsule (50,000 Units total) by mouth every 7 (seven) days. 4 capsule 3   No facility-administered medications prior to visit.     ROS Review of Systems  Constitutional: Positive for chills and fatigue. Negative for diaphoresis and fever.  HENT: Negative.  Negative for sore throat and trouble swallowing.   Eyes: Negative.   Respiratory: Negative for cough, shortness of breath and wheezing.   Cardiovascular: Negative.  Negative for chest pain.  Gastrointestinal: Positive for abdominal pain, diarrhea, nausea and vomiting. Negative for blood in stool and constipation.  Endocrine: Negative.   Genitourinary: Negative.  Negative for decreased urine volume and difficulty urinating.  Musculoskeletal: Positive for myalgias. Negative for back pain and neck pain.  Skin: Negative.   Negative for color change, pallor and rash.  Neurological: Negative.  Negative for dizziness, weakness, light-headedness and headaches.  Hematological: Negative for adenopathy. Does not bruise/bleed easily.  Psychiatric/Behavioral: Negative.     Objective:  BP 130/80 (BP Location: Left Arm, Patient Position: Sitting, Cuff Size: Large)   Pulse 71   Temp 98.4 F (36.9 C) (Oral)   Ht 5\' 9"  (1.753 m)   Wt 201 lb 4 oz (91.3 kg)   SpO2 99%   BMI 29.72 kg/m   BP Readings from Last 3 Encounters:  10/16/17 130/80  06/08/17 124/74  10/27/16 116/80    Wt Readings from Last 3 Encounters:  10/16/17 201 lb 4 oz (91.3 kg)  06/08/17 194 lb 9.6 oz (88.3 kg)  10/27/16 193 lb 3.2 oz (87.6 kg)    Physical Exam  Constitutional: She is oriented to person, place, and time.  Non-toxic appearance. She does not have a sickly appearance. She does not appear ill. No distress.  HENT:  Mouth/Throat: Oropharynx is clear and moist. No oropharyngeal exudate.  Eyes: Conjunctivae are normal. Left eye exhibits no discharge. No scleral icterus.  Neck: Normal range of motion. Neck supple. No JVD present. No thyromegaly present.  Cardiovascular: Normal rate, regular rhythm and normal heart sounds. Exam reveals no gallop.  No murmur heard. Pulmonary/Chest: Effort normal and breath sounds normal. No respiratory distress. She has no wheezes. She has no rales.  Abdominal: Soft. Normal appearance and bowel sounds are normal. She exhibits no  mass. There is no hepatosplenomegaly. There is no tenderness. There is no rebound and no CVA tenderness.  Musculoskeletal: Normal range of motion. She exhibits no edema or tenderness.  Lymphadenopathy:    She has no cervical adenopathy.  Neurological: She is alert and oriented to person, place, and time.  Skin: Skin is warm and dry. No rash noted. She is not diaphoretic. No erythema.  Vitals reviewed.   Lab Results  Component Value Date   WBC 8.7 12/31/2015   HGB 13.2  12/31/2015   HCT 39.3 12/31/2015   PLT 159.0 12/31/2015   GLUCOSE 83 12/31/2015   ALT 12 12/31/2015   AST 18 12/31/2015   NA 140 12/31/2015   K 4.1 12/31/2015   CL 104 12/31/2015   CREATININE 1.03 12/31/2015   BUN 19 12/31/2015   CO2 28 12/31/2015   TSH 2.47 10/27/2016    No results found.  Assessment & Plan:   Erin was seen today for diarrhea.  Diagnoses and all orders for this visit:  Viral gastroenteritis -     promethazine (PHENERGAN) 12.5 MG tablet; Take 1 tablet (12.5 mg total) by mouth every 6 (six) hours as needed for nausea or vomiting.   I am having Erin [name] start on promethazine. I am also having her maintain her multivitamin, Vitamin B-12, cetirizine, Azelastine-Fluticasone, albuterol, SYNTHROID, and Vitamin D (Ergocalciferol).  Meds ordered this encounter  Medications  . promethazine (PHENERGAN) 12.5 MG tablet    Sig: Take 1 tablet (12.5 mg total) by mouth every 6 (six) hours as needed for nausea or vomiting.    Dispense:  30 tablet    Refill:  0     Follow-up: Return if symptoms worsen or fail to improve.  Sanda Linger, MD

## 2017-12-01 ENCOUNTER — Other Ambulatory Visit: Payer: Self-pay | Admitting: Family Medicine

## 2017-12-07 ENCOUNTER — Telehealth: Payer: Self-pay | Admitting: Internal Medicine

## 2017-12-07 NOTE — Telephone Encounter (Signed)
Pt has not been seen at our office since 06/08/17. Per pt's last OV with TP, pt was told to return back to the office in 4 months around 10/08/17.  Pt does not have a f/u appt scheduled at our office.    Attempted to call pt but no answer. Left message for pt to return our call x1

## 2017-12-08 NOTE — Telephone Encounter (Signed)
Spoke with patient. She stated that she was asking for a refill on her Zyrtec. Advised patient that she is overdue for appt with CY. Patient stated that she did not want to come in just a refill on Zyrtec. Advised her that Zyrtec is available over the counter and our office can supply her with refills without being seen.   She verbalized understanding. Nothing else needed at time of call.

## 2017-12-31 ENCOUNTER — Other Ambulatory Visit: Payer: Self-pay | Admitting: Family Medicine

## 2018-03-20 ENCOUNTER — Encounter: Payer: Self-pay | Admitting: Nurse Practitioner

## 2018-03-20 ENCOUNTER — Ambulatory Visit: Payer: BC Managed Care – PPO | Admitting: Nurse Practitioner

## 2018-03-20 VITALS — BP 124/84 | HR 74 | Temp 97.8°F | Ht 69.0 in | Wt 202.0 lb

## 2018-03-20 DIAGNOSIS — J209 Acute bronchitis, unspecified: Secondary | ICD-10-CM

## 2018-03-20 DIAGNOSIS — J0140 Acute pansinusitis, unspecified: Secondary | ICD-10-CM | POA: Diagnosis not present

## 2018-03-20 MED ORDER — HYDROCODONE-HOMATROPINE 5-1.5 MG/5ML PO SYRP: 5.0000 mL | ORAL_SOLUTION | Freq: Two times a day (BID) | ORAL | 0 refills | Status: DC | PRN

## 2018-03-20 MED ORDER — AZITHROMYCIN 250 MG PO TABS: 250.0000 mg | ORAL_TABLET | Freq: Every day | ORAL | 0 refills | Status: DC

## 2018-03-20 MED ORDER — GUAIFENESIN-DM 100-10 MG/5ML PO SYRP
5.0000 mL | ORAL_SOLUTION | ORAL | 1 refills | Status: DC | PRN
Start: 2018-03-20 — End: 2018-05-03

## 2018-03-20 MED ORDER — OXYMETAZOLINE HCL 0.05 % NA SOLN: 1.0000 | Freq: Two times a day (BID) | NASAL | 0 refills | Status: DC

## 2018-03-20 MED ORDER — FLUTICASONE PROPIONATE 50 MCG/ACT NA SUSP: 2.0000 | Freq: Every day | NASAL | 1 refills | Status: DC

## 2018-03-20 NOTE — Progress Notes (Signed)
Subjective:  Patient ID: Erin [name], female    DOB: April 08, 1981  Age: 37 y.o. MRN: [id number]  CC: Cough (coughing green mucus,sore throat,sinus pressure,runny nose and weakness/ going on for 5 days. )  Sinus Problem  This is a new problem. The current episode started in the past 7 days. The problem has been gradually worsening since onset. There has been no fever. Associated symptoms include chills, congestion, coughing, headaches, a hoarse voice, sinus pressure, sneezing, a sore throat and swollen glands. Pertinent negatives include no diaphoresis, ear pain, neck pain or shortness of breath. Past treatments include oral decongestants. The treatment provided no relief.    Outpatient Medications Prior to Visit  Medication Sig Dispense Refill  . Multiple Vitamin (MULTIVITAMIN) tablet Take 1 tablet by mouth daily.      Marland Kitchen SYNTHROID 137 MCG tablet Take 1 tablet (137 mcg total) by mouth daily before breakfast. 30 tablet 0  . SYNTHROID 137 MCG tablet TAKE 1 TABLET BEFORE BREAKFAST 30 tablet 2  . Vitamin D, Ergocalciferol, (DRISDOL) 50000 units CAPS capsule Take 1 capsule (50,000 Units total) by mouth every 7 (seven) days. 4 capsule 3  . albuterol (VENTOLIN HFA) 108 (90 Base) MCG/ACT inhaler Inhale 2 puffs into the lungs every 6 (six) hours as needed. 1 Inhaler 3  . Azelastine-Fluticasone 137-50 MCG/ACT SUSP Place 2 sprays into the nose at bedtime. 23 g 2  . cetirizine (ZYRTEC) 10 MG tablet Take 1 tablet (10 mg total) by mouth daily. 30 tablet 11  . Cyanocobalamin (VITAMIN B-12) 2500 MCG SUBL Place 1 tablet under the tongue daily.      . promethazine (PHENERGAN) 12.5 MG tablet Take 1 tablet (12.5 mg total) by mouth every 6 (six) hours as needed for nausea or vomiting. 30 tablet 0   No facility-administered medications prior to visit.     ROS See HPI  Objective:  BP 124/84   Pulse 74   Temp 97.8 F (36.6 C) (Oral)   Ht 5\' 9"  (1.753 m)   Wt 202 lb (91.6 kg)   SpO2 99%   BMI 29.83 kg/m     BP Readings from Last 3 Encounters:  03/20/18 124/84  10/16/17 130/80  06/08/17 124/74    Wt Readings from Last 3 Encounters:  03/20/18 202 lb (91.6 kg)  10/16/17 201 lb 4 oz (91.3 kg)  06/08/17 194 lb 9.6 oz (88.3 kg)    Physical Exam  Constitutional: She is oriented to person, place, and time. No distress.  HENT:  Right Ear: External ear normal.  Left Ear: External ear normal.  Nose: Mucosal edema and rhinorrhea present. Right sinus exhibits maxillary sinus tenderness and frontal sinus tenderness. Left sinus exhibits maxillary sinus tenderness and frontal sinus tenderness.  Mouth/Throat: Uvula is midline. Posterior oropharyngeal erythema present. No oropharyngeal exudate or posterior oropharyngeal edema.  Neck: Normal range of motion. Neck supple.  Cardiovascular: Normal rate, regular rhythm and normal heart sounds.  Pulmonary/Chest: Effort normal and breath sounds normal.  Lymphadenopathy:    She has cervical adenopathy.  Neurological: She is alert and oriented to person, place, and time.  Skin: Skin is warm and dry. No rash noted.  Vitals reviewed.   Lab Results  Component Value Date   WBC 8.7 12/31/2015   HGB 13.2 12/31/2015   HCT 39.3 12/31/2015   PLT 159.0 12/31/2015   GLUCOSE 83 12/31/2015   ALT 12 12/31/2015   AST 18 12/31/2015   NA 140 12/31/2015   K 4.1 12/31/2015  CL 104 12/31/2015   CREATININE 1.03 12/31/2015   BUN 19 12/31/2015   CO2 28 12/31/2015   TSH 2.47 10/27/2016    Assessment & Plan:   Erin was seen today for cough.  Diagnoses and all orders for this visit:  Acute non-recurrent pansinusitis -     azithromycin (ZITHROMAX Z-PAK) 250 MG tablet; Take 1 tablet (250 mg total) by mouth daily. Take 2tabs on first day, then 1tab once a day till complete -     guaiFENesin-dextromethorphan (ROBITUSSIN DM) 100-10 MG/5ML syrup; Take 5 mLs by mouth every 4 (four) hours as needed for cough. -     fluticasone (FLONASE) 50 MCG/ACT nasal spray; Place 2  sprays into both nostrils daily. -     oxymetazoline (AFRIN NASAL SPRAY) 0.05 % nasal spray; Place 1 spray into both nostrils 2 (two) times daily. Use only for 3days, then stop  Acute bronchitis, unspecified organism -     azithromycin (ZITHROMAX Z-PAK) 250 MG tablet; Take 1 tablet (250 mg total) by mouth daily. Take 2tabs on first day, then 1tab once a day till complete -     guaiFENesin-dextromethorphan (ROBITUSSIN DM) 100-10 MG/5ML syrup; Take 5 mLs by mouth every 4 (four) hours as needed for cough. -     fluticasone (FLONASE) 50 MCG/ACT nasal spray; Place 2 sprays into both nostrils daily. -     oxymetazoline (AFRIN NASAL SPRAY) 0.05 % nasal spray; Place 1 spray into both nostrils 2 (two) times daily. Use only for 3days, then stop -     HYDROcodone-homatropine (HYCODAN) 5-1.5 MG/5ML syrup; Take 5 mLs by mouth every 12 (twelve) hours as needed for cough.   I have discontinued [name]. Hiegel's Vitamin B-12, cetirizine, Azelastine-Fluticasone, albuterol, and promethazine. I am also having her start on azithromycin, guaiFENesin-dextromethorphan, fluticasone, oxymetazoline, and HYDROcodone-homatropine. Additionally, I am having her maintain her multivitamin, SYNTHROID, Vitamin D (Ergocalciferol), and SYNTHROID.  Meds ordered this encounter  Medications  . azithromycin (ZITHROMAX Z-PAK) 250 MG tablet    Sig: Take 1 tablet (250 mg total) by mouth daily. Take 2tabs on first day, then 1tab once a day till complete    Dispense:  6 tablet    Refill:  0    Order Specific Question:   Supervising Provider    Answer:   Dianne Dun [[id number]]  . guaiFENesin-dextromethorphan (ROBITUSSIN DM) 100-10 MG/5ML syrup    Sig: Take 5 mLs by mouth every 4 (four) hours as needed for cough.    Dispense:  236 mL    Refill:  1    Order Specific Question:   Supervising Provider    Answer:   Dianne Dun [[id number]]  . fluticasone (FLONASE) 50 MCG/ACT nasal spray    Sig: Place 2 sprays into both nostrils daily.     Dispense:  16 g    Refill:  1    Order Specific Question:   Supervising Provider    Answer:   Dianne Dun [[id number]]  . oxymetazoline (AFRIN NASAL SPRAY) 0.05 % nasal spray    Sig: Place 1 spray into both nostrils 2 (two) times daily. Use only for 3days, then stop    Dispense:  14.7 mL    Refill:  0    Order Specific Question:   Supervising Provider    Answer:   Dianne Dun [[id number]]  . HYDROcodone-homatropine (HYCODAN) 5-1.5 MG/5ML syrup    Sig: Take 5 mLs by mouth every 12 (twelve) hours as needed for cough.  Dispense:  60 mL    Refill:  0    Order Specific Question:   Supervising Provider    Answer:   Dianne DunARON, TALIA M [[id number]]    Follow-up: Return if symptoms worsen or fail to improve.  Alysia Pennaharlotte Nickola Lenig, NP

## 2018-03-20 NOTE — Patient Instructions (Signed)
[facility] Instructions: Flonase and Afrin use: apply 1spray of afrin in each nare, wait 5mins, then apply 2sprays of flonase in each nare. Use both nasal spray consecutively x 3days, then flonase only for at least 14days.  Encourage adequate oral hydration. 

## 2018-03-21 ENCOUNTER — Telehealth: Payer: Self-pay | Admitting: Family Medicine

## 2018-03-21 MED ORDER — FLUCONAZOLE 150 MG PO TABS: 150.0000 mg | ORAL_TABLET | Freq: Once | ORAL | 0 refills | Status: AC

## 2018-03-21 NOTE — Telephone Encounter (Signed)
Ok to sent, 1tab

## 2018-03-21 NOTE — Telephone Encounter (Signed)
[facility] called requesting Fluconazole 150 mg tab to go along with azithromycin. Please advise.

## 2018-03-21 NOTE — Telephone Encounter (Signed)
Rx sent to [facility]

## 2018-04-11 ENCOUNTER — Other Ambulatory Visit: Payer: Self-pay | Admitting: Nurse Practitioner

## 2018-04-11 DIAGNOSIS — J0140 Acute pansinusitis, unspecified: Secondary | ICD-10-CM

## 2018-04-11 DIAGNOSIS — J209 Acute bronchitis, unspecified: Secondary | ICD-10-CM

## 2018-04-30 ENCOUNTER — Other Ambulatory Visit: Payer: Self-pay | Admitting: Family Medicine

## 2018-05-01 ENCOUNTER — Other Ambulatory Visit: Payer: Self-pay

## 2018-05-01 NOTE — Telephone Encounter (Signed)
Received medication refill request for synthroid 137 mcg tablet.   Last OV: 10/27/2016 Last Refill: 05/08/2017  Per note to pharmacy from provider called pt to schedule appt. Pt due for further evaluation and/or laboratory testing before further refills are given.   Pt scheduled for 05/03/18

## 2018-05-03 ENCOUNTER — Encounter: Payer: Self-pay | Admitting: Family Medicine

## 2018-05-03 ENCOUNTER — Ambulatory Visit: Payer: BC Managed Care – PPO | Admitting: Family Medicine

## 2018-05-03 VITALS — BP 117/69 | HR 84 | Temp 99.0°F | Resp 16 | Ht 69.0 in | Wt 207.0 lb

## 2018-05-03 DIAGNOSIS — N920 Excessive and frequent menstruation with regular cycle: Secondary | ICD-10-CM | POA: Diagnosis not present

## 2018-05-03 DIAGNOSIS — E039 Hypothyroidism, unspecified: Secondary | ICD-10-CM | POA: Diagnosis not present

## 2018-05-03 MED ORDER — NORETHIN-ETH ESTRAD-FE BIPHAS 1 MG-10 MCG / 10 MCG PO TABS: 1.0000 | ORAL_TABLET | Freq: Every day | ORAL | 11 refills | Status: DC

## 2018-05-03 NOTE — Progress Notes (Signed)
Patient ID: Erin [name], female    DOB: 03/31/1981  Age: 37 y.o. MRN: [id number]    Subjective:  Subjective  HPI Erin [name] presents for f/u hypothyroidism.  No complaints.   Review of Systems  Constitutional: Negative for chills and fever.  HENT: Negative for congestion and hearing loss.   Eyes: Negative for discharge.  Respiratory: Negative for cough and shortness of breath.   Cardiovascular: Negative for chest pain, palpitations and leg swelling.  Gastrointestinal: Negative for abdominal pain, blood in stool, constipation, diarrhea, nausea and vomiting.  Genitourinary: Negative for dysuria, frequency, hematuria and urgency.  Musculoskeletal: Negative for back pain and myalgias.  Skin: Negative for rash.  Allergic/Immunologic: Negative for environmental allergies.  Neurological: Negative for dizziness, weakness and headaches.  Hematological: Does not bruise/bleed easily.  Psychiatric/Behavioral: Negative for suicidal ideas. The patient is not nervous/anxious.     History Past Medical History:  Diagnosis Date  . Abortion history    x3  . Asthma   . Elevated blood pressure   . Hypertension     She has a past surgical history that includes Induced abortion; Breast surgery; and Thyroidectomy (10/30/08).   Her family history includes Coronary artery disease in her father; Diabetes in her maternal aunt; Hypertension in her mother; Lung cancer in her unknown relative; Stroke in her father.She reports that she has never smoked. She has never used smokeless tobacco. She reports that she drinks about 3.0 standard drinks of alcohol per week. She reports that she does not use drugs.  Current Outpatient Medications on File Prior to Visit  Medication Sig Dispense Refill  . Multiple Vitamin (MULTIVITAMIN) tablet Take 1 tablet by mouth daily.      Marland Kitchen. SYNTHROID 137 MCG tablet Take 1 tablet (137 mcg total) by mouth daily before breakfast. 30 tablet 0  . SYNTHROID 137 MCG tablet TAKE 1  TABLET BEFORE BREAKFAST 30 tablet 2  . Vitamin D, Ergocalciferol, (DRISDOL) 50000 units CAPS capsule Take 1 capsule (50,000 Units total) by mouth every 7 (seven) days. 4 capsule 3   No current facility-administered medications on file prior to visit.      Objective:  Objective  Physical Exam  Constitutional: She is oriented to person, place, and time. She appears well-developed and well-nourished.  HENT:  Head: Normocephalic and atraumatic.  Eyes: Conjunctivae and EOM are normal.  Neck: Normal range of motion. Neck supple. No JVD present. Carotid bruit is not present. No thyromegaly present.  Cardiovascular: Normal rate, regular rhythm and normal heart sounds.  No murmur heard. Pulmonary/Chest: Effort normal and breath sounds normal. No respiratory distress. She has no wheezes. She has no rales. She exhibits no tenderness.  Musculoskeletal: She exhibits no edema.  Neurological: She is alert and oriented to person, place, and time.  Psychiatric: She has a normal mood and affect.  Nursing note and vitals reviewed.  BP 117/69 (BP Location: Right Arm, Cuff Size: Large)   Pulse 84   Temp 99 F (37.2 C) (Oral)   Resp 16   Ht 5\' 9"  (1.753 m)   Wt 207 lb (93.9 kg)   LMP 05/02/2018   SpO2 100%   BMI 30.57 kg/m  Wt Readings from Last 3 Encounters:  05/03/18 207 lb (93.9 kg)  03/20/18 202 lb (91.6 kg)  10/16/17 201 lb 4 oz (91.3 kg)     Lab Results  Component Value Date   WBC 6.6 05/03/2018   HGB 13.1 05/03/2018   HCT 39.1 05/03/2018   PLT  240.0 05/03/2018   GLUCOSE 82 05/03/2018   ALT 12 05/03/2018   AST 19 05/03/2018   NA 138 05/03/2018   K 4.5 05/03/2018   CL 102 05/03/2018   CREATININE 1.01 05/03/2018   BUN 16 05/03/2018   CO2 30 05/03/2018   TSH 4.04 05/03/2018    No results found.   Assessment & Plan:  Plan  I have discontinued Erin [name]'s azithromycin, guaiFENesin-dextromethorphan, oxymetazoline, HYDROcodone-homatropine, and fluticasone. I am also having  her start on Norethindrone-Ethinyl Estradiol-Fe Biphas. Additionally, I am having her maintain her multivitamin, SYNTHROID, Vitamin D (Ergocalciferol), and SYNTHROID.  Meds ordered this encounter  Medications  . Norethindrone-Ethinyl Estradiol-Fe Biphas (LO LOESTRIN FE) 1 MG-10 MCG / 10 MCG tablet    Sig: Take 1 tablet by mouth daily.    Dispense:  1 Package    Refill:  11    Problem List Items Addressed This Visit    None    Visit Diagnoses    Hypothyroidism, unspecified type    -  Primary   Relevant Orders   TSH (Completed)   Morbid obesity (HCC)       Relevant Orders   Amb Ref to Medical Weight Management   Menorrhagia with regular cycle       Relevant Medications   Norethindrone-Ethinyl Estradiol-Fe Biphas (LO LOESTRIN FE) 1 MG-10 MCG / 10 MCG tablet   Other Relevant Orders   CBC with Differential/Platelet (Completed)   Comprehensive metabolic panel (Completed)      Follow-up: Return in about 3 months (around 08/03/2018), or if symptoms worsen or fail to improve, for annual exam, fasting.  Donato Schultz, DO

## 2018-05-03 NOTE — Patient Instructions (Signed)
Hypothyroidism Hypothyroidism is a disorder of the thyroid. The thyroid is a large gland that is located in the lower front of the neck. The thyroid releases hormones that control how the body works. With hypothyroidism, the thyroid does not make enough of these hormones. What are the causes? Causes of hypothyroidism may include:  Viral infections.  Pregnancy.  Your own defense system (immune system) attacking your thyroid.  Certain medicines.  Birth defects.  Past radiation treatments to your head or neck.  Past treatment with radioactive iodine.  Past surgical removal of part or all of your thyroid.  Problems with the gland that is located in the center of your brain (pituitary).  What are the signs or symptoms? Signs and symptoms of hypothyroidism may include:  Feeling as though you have no energy (lethargy).  Inability to tolerate cold.  Weight gain that is not explained by a change in diet or exercise habits.  Dry skin.  Coarse hair.  Menstrual irregularity.  Slowing of thought processes.  Constipation.  Sadness or depression.  How is this diagnosed? Your health care provider may diagnose hypothyroidism with blood tests and ultrasound tests. How is this treated? Hypothyroidism is treated with medicine that replaces the hormones that your body does not make. After you begin treatment, it may take several weeks for symptoms to go away. Follow these instructions at home:  Take medicines only as directed by your health care provider.  If you start taking any new medicines, tell your health care provider.  Keep all follow-up visits as directed by your health care provider. This is important. As your condition improves, your dosage needs may change. You will need to have blood tests regularly so that your health care provider can watch your condition. Contact a health care provider if:  Your symptoms do not get better with treatment.  You are taking thyroid  replacement medicine and: ? You sweat excessively. ? You have tremors. ? You feel anxious. ? You lose weight rapidly. ? You cannot tolerate heat. ? You have emotional swings. ? You have diarrhea. ? You feel weak. Get help right away if:  You develop chest pain.  You develop an irregular heartbeat.  You develop a rapid heartbeat. This information is not intended to replace advice given to you by your health care provider. Make sure you discuss any questions you have with your health care provider. Document Released: 08/29/2005 Document Revised: 02/04/2016 Document Reviewed: 01/14/2014 [employer]vier Interactive Patient Education  2018 [employer]  

## 2018-05-04 LAB — COMPREHENSIVE METABOLIC PANEL
ALBUMIN: 4.2 g/dL (ref 3.5–5.2)
ALT: 12 U/L (ref 0–35)
AST: 19 U/L (ref 0–37)
Alkaline Phosphatase: 52 U/L (ref 39–117)
BUN: 16 mg/dL (ref 6–23)
CO2: 30 mEq/L (ref 19–32)
Calcium: 9.4 mg/dL (ref 8.4–10.5)
Chloride: 102 mEq/L (ref 96–112)
Creatinine, Ser: 1.01 mg/dL (ref 0.40–1.20)
GFR: 78.52 mL/min (ref >60.00)
Glucose, Bld: 82 mg/dL (ref 70–99)
POTASSIUM: 4.5 meq/L (ref 3.5–5.1)
Sodium: 138 mEq/L (ref 135–145)
Total Bilirubin: 0.4 mg/dL (ref 0.2–1.2)
Total Protein: 7.3 g/dL (ref 6.0–8.3)

## 2018-05-04 LAB — CBC WITH DIFFERENTIAL/PLATELET
Basophils Absolute: 0.1 10*3/uL (ref 0.0–0.1)
Basophils Relative: 1.2 % (ref 0.0–3.0)
EOS PCT: 2.7 % (ref 0.0–5.0)
Eosinophils Absolute: 0.2 10*3/uL (ref 0.0–0.7)
HCT: 39.1 % (ref 36.0–46.0)
HEMOGLOBIN: 13.1 g/dL (ref 12.0–15.0)
Lymphocytes Relative: 27.4 % (ref 12.0–46.0)
Lymphs Abs: 1.8 10*3/uL (ref 0.7–4.0)
MCHC: 33.6 g/dL (ref 30.0–36.0)
MCV: 87 fl (ref 78.0–100.0)
MONOS PCT: 8.1 % (ref 3.0–12.0)
Monocytes Absolute: 0.5 10*3/uL (ref 0.1–1.0)
Neutro Abs: 4 10*3/uL (ref 1.4–7.7)
Neutrophils Relative %: 60.6 % (ref 43.0–77.0)
Platelets: 240 10*3/uL (ref 150.0–400.0)
RBC: 4.49 Mil/uL (ref 3.87–5.11)
RDW: 14.9 % (ref 11.5–15.5)
WBC: 6.6 10*3/uL (ref 4.0–10.5)

## 2018-05-04 LAB — TSH: TSH: 4.04 u[IU]/mL (ref 0.35–4.50)

## 2018-05-08 ENCOUNTER — Encounter: Payer: Self-pay | Admitting: *Deleted

## 2018-05-21 ENCOUNTER — Other Ambulatory Visit: Payer: Self-pay | Admitting: Family Medicine

## 2018-07-02 ENCOUNTER — Encounter: Payer: Self-pay | Admitting: Family Medicine

## 2018-07-02 ENCOUNTER — Ambulatory Visit: Payer: BC Managed Care – PPO | Admitting: Family Medicine

## 2018-07-02 VITALS — BP 132/86 | HR 81 | Temp 98.2°F | Resp 16 | Ht 69.0 in | Wt 213.8 lb

## 2018-07-02 DIAGNOSIS — J0110 Acute frontal sinusitis, unspecified: Secondary | ICD-10-CM

## 2018-07-02 MED ORDER — FLUCONAZOLE 150 MG PO TABS: ORAL_TABLET | ORAL | 0 refills | Status: DC

## 2018-07-02 MED ORDER — LEVOCETIRIZINE DIHYDROCHLORIDE 5 MG PO TABS: 5.0000 mg | ORAL_TABLET | Freq: Every evening | ORAL | 5 refills | Status: DC

## 2018-07-02 MED ORDER — AMOXICILLIN-POT CLAVULANATE 875-125 MG PO TABS: 1.0000 | ORAL_TABLET | Freq: Two times a day (BID) | ORAL | 0 refills | Status: DC

## 2018-07-02 MED ORDER — FLUTICASONE PROPIONATE 50 MCG/ACT NA SUSP: 2.0000 | Freq: Every day | NASAL | 6 refills | Status: DC

## 2018-07-02 NOTE — Progress Notes (Signed)
Patient ID: Erin [name], female    DOB: [birth year]  Age: 37 y.o. MRN: [id number]    Subjective:  Subjective  HPI Erin [name] presents for sinus drainage, cough--productive No fever Symptoms x 4 days Using otc with no relief.  Review of Systems  Constitutional: Negative for chills and fever.  HENT: Positive for congestion, ear pain, postnasal drip, rhinorrhea and sinus pressure. Negative for hearing loss.   Eyes: Negative for discharge.  Respiratory: Positive for cough. Negative for chest tightness, shortness of breath and wheezing.   Cardiovascular: Negative for chest pain, palpitations and leg swelling.  Gastrointestinal: Negative for abdominal pain, blood in stool, constipation, diarrhea, nausea and vomiting.  Genitourinary: Negative for dysuria, frequency, hematuria and urgency.  Musculoskeletal: Negative for back pain and myalgias.  Skin: Negative for rash.  Allergic/Immunologic: Negative for environmental allergies.  Neurological: Negative for dizziness, weakness and headaches.  Hematological: Does not bruise/bleed easily.  Psychiatric/Behavioral: Negative for suicidal ideas. The patient is not nervous/anxious.     History Past Medical History:  Diagnosis Date  . Abortion history    x3  . Asthma   . Elevated blood pressure   . Hypertension     She has a past surgical history that includes Induced abortion; Breast surgery; and Thyroidectomy (10/30/08).   Her family history includes Coronary artery disease in her father; Diabetes in her maternal aunt; Hypertension in her mother; Lung cancer in her unknown relative; Stroke in her father.She reports that she has never smoked. She has never used smokeless tobacco. She reports that she drinks about 3.0 standard drinks of alcohol per week. She reports that she does not use drugs.  Current Outpatient Medications on File Prior to Visit  Medication Sig Dispense Refill  . Multiple Vitamin (MULTIVITAMIN) tablet Take 1 tablet by  mouth daily.      . Norethindrone-Ethinyl Estradiol-Fe Biphas (LO LOESTRIN FE) 1 MG-10 MCG / 10 MCG tablet Take 1 tablet by mouth daily. 1 Package 11  . SYNTHROID 137 MCG tablet TAKE 1 TABLET BEFORE BREAKFAST 30 tablet 5  . Vitamin D, Ergocalciferol, (DRISDOL) 50000 units CAPS capsule Take 1 capsule (50,000 Units total) by mouth every 7 (seven) days. 4 capsule 3   No current facility-administered medications on file prior to visit.      Objective:  Objective  Physical Exam  Constitutional: She is oriented to person, place, and time. She appears well-developed and well-nourished.  HENT:  Right Ear: External ear normal.  Left Ear: External ear normal.  Nose: Right sinus exhibits frontal sinus tenderness. Left sinus exhibits frontal sinus tenderness.  + PND + errythema  Eyes: Conjunctivae are normal. Right eye exhibits no discharge. Left eye exhibits no discharge.  Cardiovascular: Normal rate, regular rhythm and normal heart sounds.  No murmur heard. Pulmonary/Chest: Effort normal and breath sounds normal. No respiratory distress. She has no wheezes. She has no rales. She exhibits no tenderness.  Musculoskeletal: She exhibits no edema.  Lymphadenopathy:    She has cervical adenopathy.  Neurological: She is alert and oriented to person, place, and time.  Nursing note and vitals reviewed.  BP 132/86 (BP Location: Right Arm, Cuff Size: Large)   Pulse 81   Temp 98.2 F (36.8 C) (Oral)   Resp 16   Ht 5\' 9"  (1.753 m)   Wt 213 lb 12.8 oz (97 kg)   LMP 06/14/2018   SpO2 98%   BMI 31.57 kg/m  Wt Readings from Last 3 Encounters:  07/02/18 213 lb 12.8  oz (97 kg)  05/03/18 207 lb (93.9 kg)  03/20/18 202 lb (91.6 kg)     Lab Results  Component Value Date   WBC 6.6 05/03/2018   HGB 13.1 05/03/2018   HCT 39.1 05/03/2018   PLT 240.0 05/03/2018   GLUCOSE 82 05/03/2018   ALT 12 05/03/2018   AST 19 05/03/2018   NA 138 05/03/2018   K 4.5 05/03/2018   CL 102 05/03/2018   CREATININE  1.01 05/03/2018   BUN 16 05/03/2018   CO2 30 05/03/2018   TSH 4.04 05/03/2018    No results found.   Assessment & Plan:  Plan  I am having Erin [name] start on amoxicillin-clavulanate, fluticasone, levocetirizine, and fluconazole. I am also having her maintain her multivitamin, Vitamin D (Ergocalciferol), Norethindrone-Ethinyl Estradiol-Fe Biphas, and SYNTHROID.  Meds ordered this encounter  Medications  . amoxicillin-clavulanate (AUGMENTIN) 875-125 MG tablet    Sig: Take 1 tablet by mouth 2 (two) times daily.    Dispense:  20 tablet    Refill:  0  . fluticasone (FLONASE) 50 MCG/ACT nasal spray    Sig: Place 2 sprays into both nostrils daily.    Dispense:  16 g    Refill:  6  . levocetirizine (XYZAL) 5 MG tablet    Sig: Take 1 tablet (5 mg total) by mouth every evening.    Dispense:  30 tablet    Refill:  5  . fluconazole (DIFLUCAN) 150 MG tablet    Sig: 1 po x1, may repeat in 3 days prn    Dispense:  2 tablet    Refill:  0    Problem List Items Addressed This Visit    None    Visit Diagnoses    Acute frontal sinusitis, recurrence not specified    -  Primary   Relevant Medications   amoxicillin-clavulanate (AUGMENTIN) 875-125 MG tablet   fluticasone (FLONASE) 50 MCG/ACT nasal spray   levocetirizine (XYZAL) 5 MG tablet   fluconazole (DIFLUCAN) 150 MG tablet      Follow-up: Return if symptoms worsen or fail to improve.  Donato Schultz, DO

## 2018-07-02 NOTE — Patient Instructions (Signed)

## 2018-07-10 ENCOUNTER — Encounter (INDEPENDENT_AMBULATORY_CARE_PROVIDER_SITE_OTHER): Payer: BC Managed Care – PPO

## 2018-07-23 ENCOUNTER — Ambulatory Visit (INDEPENDENT_AMBULATORY_CARE_PROVIDER_SITE_OTHER): Payer: BC Managed Care – PPO | Admitting: Bariatrics

## 2018-07-26 ENCOUNTER — Ambulatory Visit (INDEPENDENT_AMBULATORY_CARE_PROVIDER_SITE_OTHER): Payer: BC Managed Care – PPO | Admitting: Family Medicine

## 2018-07-26 ENCOUNTER — Other Ambulatory Visit (HOSPITAL_COMMUNITY)
Admission: RE | Admit: 2018-07-26 | Discharge: 2018-07-26 | Disposition: A | Discharge status: Home / Self Care | Payer: BC Managed Care – PPO | Source: Ambulatory Visit | Attending: Family Medicine | Admitting: Family Medicine

## 2018-07-26 ENCOUNTER — Encounter: Payer: Self-pay | Admitting: Family Medicine

## 2018-07-26 VITALS — BP 143/84 | HR 83 | Temp 98.7°F | Resp 16 | Ht 69.0 in | Wt 219.6 lb

## 2018-07-26 DIAGNOSIS — Z0000 Encounter for general adult medical examination without abnormal findings: Secondary | ICD-10-CM

## 2018-07-26 DIAGNOSIS — Z124 Encounter for screening for malignant neoplasm of cervix: Secondary | ICD-10-CM | POA: Diagnosis not present

## 2018-07-26 DIAGNOSIS — Z23 Encounter for immunization: Secondary | ICD-10-CM | POA: Diagnosis not present

## 2018-07-26 DIAGNOSIS — Z114 Encounter for screening for human immunodeficiency virus [HIV]: Secondary | ICD-10-CM | POA: Diagnosis not present

## 2018-07-26 NOTE — Progress Notes (Signed)
Subjective:     Erin [name] is a 37 y.o. female and is here for a comprehensive physical exam. The patient reports no problems.  Social History   Socioeconomic History  . Marital status: Single    Spouse name: n/a  . Number of children: 0  . Years of education: Master's  . Highest education level: Not on file  Occupational History  . Occupation: Wellsite geologist: [employer]  Social Needs  . Financial resource strain: Not on file  . Food insecurity:    Worry: Not on file    Inability: Not on file  . Transportation needs:    Medical: Not on file    Non-medical: Not on file  Tobacco Use  . Smoking status: Never Smoker  . Smokeless tobacco: Never Used  Substance and Sexual Activity  . Alcohol use: Yes    Alcohol/week: 3.0 standard drinks    Types: 3 Standard drinks or equivalent per week  . Drug use: No  . Sexual activity: Yes    Partners: Male    Birth control/protection: Condom  Lifestyle  . Physical activity:    Days per week: Not on file    Minutes per session: Not on file  . Stress: Not on file  Relationships  . Social connections:    Talks on phone: Not on file    Gets together: Not on file    Attends religious service: Not on file    Active member of club or organization: Not on file    Attends meetings of clubs or organizations: Not on file    Relationship status: Not on file  . Intimate partner violence:    Fear of current or ex partner: Not on file    Emotionally abused: Not on file    Physically abused: Not on file    Forced sexual activity: Not on file  Other Topics Concern  . Not on file  Social History Narrative   Working on her doctoral degree.  Lives alone.   Health Maintenance  Topic Date Due  . HIV Screening  06/14/1994  . PAP SMEAR  06/18/2016  . TETANUS/TDAP  07/26/2028  . INFLUENZA VACCINE  Completed    The following portions of the patient's history were reviewed and updated as appropriate:  She  has a past medical  history of Abortion history, Asthma, Elevated blood pressure, and Hypertension. She does not have any pertinent problems on file. She  has a past surgical history that includes Induced abortion; Breast surgery; and Thyroidectomy (10/30/08). Her family history includes Coronary artery disease in her father; Diabetes in her maternal aunt; Hypertension in her mother; Lung cancer in her unknown relative; Stroke in her father. She  reports that she has never smoked. She has never used smokeless tobacco. She reports that she drinks about 3.0 standard drinks of alcohol per week. She reports that she does not use drugs. She has a current medication list which includes the following prescription(s): fluconazole, fluticasone, levocetirizine, multivitamin, norethindrone-ethinyl estradiol-fe biphas, synthroid, and vitamin d (ergocalciferol). Current Outpatient Medications on File Prior to Visit  Medication Sig Dispense Refill  . fluconazole (DIFLUCAN) 150 MG tablet 1 po x1, may repeat in 3 days prn 2 tablet 0  . fluticasone (FLONASE) 50 MCG/ACT nasal spray Place 2 sprays into both nostrils daily. 16 g 6  . levocetirizine (XYZAL) 5 MG tablet Take 1 tablet (5 mg total) by mouth every evening. 30 tablet 5  . Multiple Vitamin (MULTIVITAMIN)  tablet Take 1 tablet by mouth daily.      . Norethindrone-Ethinyl Estradiol-Fe Biphas (LO LOESTRIN FE) 1 MG-10 MCG / 10 MCG tablet Take 1 tablet by mouth daily. 1 Package 11  . SYNTHROID 137 MCG tablet TAKE 1 TABLET BEFORE BREAKFAST 30 tablet 5  . Vitamin D, Ergocalciferol, (DRISDOL) 50000 units CAPS capsule Take 1 capsule (50,000 Units total) by mouth every 7 (seven) days. 4 capsule 3   No current facility-administered medications on file prior to visit.    She is allergic to shellfish allergy and sulfonamide derivatives..  Review of Systems Review of Systems  Constitutional: Negative for activity change, appetite change and fatigue.  HENT: Negative for hearing loss,  congestion, tinnitus and ear discharge.  dentist q67m Eyes: Negative for visual disturbance (see optho q1y -- vision corrected to 20/20 with glasses).  Respiratory: Negative for cough, chest tightness and shortness of breath.   Cardiovascular: Negative for chest pain, palpitations and leg swelling.  Gastrointestinal: Negative for abdominal pain, diarrhea, constipation and abdominal distention.  Genitourinary: Negative for urgency, frequency, decreased urine volume and difficulty urinating.  Musculoskeletal: Negative for back pain, arthralgias and gait problem.  Skin: Negative for color change, pallor and rash.  Neurological: Negative for dizziness, light-headedness, numbness and headaches.  Hematological: Negative for adenopathy. Does not bruise/bleed easily.  Psychiatric/Behavioral: Negative for suicidal ideas, confusion, sleep disturbance, self-injury, dysphoric mood, decreased concentration and agitation.       Objective:    BP (!) 143/84 (BP Location: Right Arm, Cuff Size: Large)   Pulse 83   Temp 98.7 F (37.1 C) (Oral)   Resp 16   Ht 5\' 9"  (1.753 m)   Wt 219 lb 9.6 oz (99.6 kg)   LMP 07/09/2018   SpO2 100%   BMI 32.43 kg/m  General appearance: alert, cooperative, appears stated age and no distress Head: Normocephalic, without obvious abnormality, atraumatic Eyes: conjunctivae/corneas clear. PERRL, EOM's intact. Fundi benign. Ears: normal TM's and external ear canals both ears Nose: Nares normal. Septum midline. Mucosa normal. No drainage or sinus tenderness. Throat: lips, mucosa, and tongue normal; teeth and gums normal Neck: no adenopathy, no carotid bruit, no JVD, supple, symmetrical, trachea midline and thyroid not enlarged, symmetric, no tenderness/mass/nodules Back: symmetric, no curvature. ROM normal. No CVA tenderness. Lungs: clear to auscultation bilaterally Breasts: normal appearance, no masses or tenderness Heart: regular rate and rhythm, S1, S2 normal, no  murmur, click, rub or gallop Abdomen: soft, non-tender; bowel sounds normal; no masses,  no organomegaly Pelvic: cervix normal in appearance, external genitalia normal, no adnexal masses or tenderness, no cervical motion tenderness, positive findings: vaginal discharge:  scant, white and thin, rectovaginal septum normal, uterus normal size, shape, and consistency and vagina normal without discharge-pap done Extremities: extremities normal, atraumatic, no cyanosis or edema Pulses: 2+ and symmetric Skin: Skin color, texture, turgor normal. No rashes or lesions Lymph nodes: Cervical, supraclavicular, and axillary nodes normal. Neurologic: Alert and oriented X 3, normal strength and tone. Normal symmetric reflexes. Normal coordination and gait    Assessment:    Healthy female exam.      Plan:    ghm utd Check labs  See After Visit Summary for Counseling Recommendations    1. Preventative health care See above - Lipid panel - CBC with Differential/Platelet - Comprehensive metabolic panel - TSH - POCT Urinalysis Dipstick (Automated)  2. Cervical cancer screening   - Cytology - PAP( [facility])  3. Encounter for screening for HIV   - HIV Antibody (  routine testing w rflx)  4. Need for Tdap vaccination   - Tdap vaccine greater than or equal to 7yo IM  5. Influenza vaccine needed   - Flu Vaccine QUAD 6+ mos PF IM (Fluarix Quad PF)

## 2018-07-26 NOTE — Patient Instructions (Signed)
Preventive Care 18-39 Years, Female Preventive care refers to lifestyle choices and visits with your health care provider that can promote health and wellness. What does preventive care include?  A yearly physical exam. This is also called an annual well check.  Dental exams once or twice a year.  Routine eye exams. Ask your health care provider how often you should have your eyes checked.  Personal lifestyle choices, including: ? Daily care of your teeth and gums. ? Regular physical activity. ? Eating a healthy diet. ? Avoiding tobacco and drug use. ? Limiting alcohol use. ? Practicing safe sex. ? Taking vitamin and mineral supplements as recommended by your health care provider. What happens during an annual well check? The services and screenings done by your health care provider during your annual well check will depend on your age, overall health, lifestyle risk factors, and family history of disease. Counseling Your health care provider may ask you questions about your:  Alcohol use.  Tobacco use.  Drug use.  Emotional well-being.  Home and relationship well-being.  Sexual activity.  Eating habits.  Work and work Statistician.  Method of birth control.  Menstrual cycle.  Pregnancy history.  Screening You may have the following tests or measurements:  Height, weight, and BMI.  Diabetes screening. This is done by checking your blood sugar (glucose) after you have not eaten for a while (fasting).  Blood pressure.  Lipid and cholesterol levels. These may be checked every 5 years starting at age 66.  Skin check.  Hepatitis C blood test.  Hepatitis B blood test.  Sexually transmitted disease (STD) testing.  BRCA-related cancer screening. This may be done if you have a family history of breast, ovarian, tubal, or peritoneal cancers.  Pelvic exam and Pap test. This may be done every 3 years starting at age 40. Starting at age 59, this may be done every 5  years if you have a Pap test in combination with an HPV test.  Discuss your test results, treatment options, and if necessary, the need for more tests with your health care provider. Vaccines Your health care provider may recommend certain vaccines, such as:  Influenza vaccine. This is recommended every year.  Tetanus, diphtheria, and acellular pertussis (Tdap, Td) vaccine. You may need a Td booster every 10 years.  Varicella vaccine. You may need this if you have not been vaccinated.  HPV vaccine. If you are 69 or younger, you may need three doses over 6 months.  Measles, mumps, and rubella (MMR) vaccine. You may need at least one dose of MMR. You may also need a second dose.  Pneumococcal 13-valent conjugate (PCV13) vaccine. You may need this if you have certain conditions and were not previously vaccinated.  Pneumococcal polysaccharide (PPSV23) vaccine. You may need one or two doses if you smoke cigarettes or if you have certain conditions.  Meningococcal vaccine. One dose is recommended if you are age 27-21 years and a first-year college student living in a residence hall, or if you have one of several medical conditions. You may also need additional booster doses.  Hepatitis A vaccine. You may need this if you have certain conditions or if you travel or work in places where you may be exposed to hepatitis A.  Hepatitis B vaccine. You may need this if you have certain conditions or if you travel or work in places where you may be exposed to hepatitis B.  Haemophilus influenzae type b (Hib) vaccine. You may need this if  you have certain risk factors.  Talk to your health care provider about which screenings and vaccines you need and how often you need them. This information is not intended to replace advice given to you by your health care provider. Make sure you discuss any questions you have with your health care provider. Document Released: 10/25/2001 Document Revised: 05/18/2016  Document Reviewed: 06/30/2015 Elsevier Interactive Patient Education  [employer].

## 2018-07-27 ENCOUNTER — Telehealth: Payer: Self-pay | Admitting: *Deleted

## 2018-07-27 LAB — COMPREHENSIVE METABOLIC PANEL
ALT: 9 U/L (ref 0–35)
AST: 15 U/L (ref 0–37)
Albumin: 4.5 g/dL (ref 3.5–5.2)
Alkaline Phosphatase: 55 U/L (ref 39–117)
BUN: 18 mg/dL (ref 6–23)
CALCIUM: 9.6 mg/dL (ref 8.4–10.5)
CHLORIDE: 103 meq/L (ref 96–112)
CO2: 29 mEq/L (ref 19–32)
CREATININE: 1.03 mg/dL (ref 0.40–1.20)
GFR: 76.67 mL/min (ref >60.00)
Glucose, Bld: 89 mg/dL (ref 70–99)
POTASSIUM: 4.4 meq/L (ref 3.5–5.1)
Sodium: 139 mEq/L (ref 135–145)
Total Bilirubin: 0.3 mg/dL (ref 0.2–1.2)
Total Protein: 7.4 g/dL (ref 6.0–8.3)

## 2018-07-27 LAB — CBC WITH DIFFERENTIAL/PLATELET
Basophils Absolute: 0.1 10*3/uL (ref 0.0–0.1)
Basophils Relative: 0.7 % (ref 0.0–3.0)
Eosinophils Absolute: 0.1 10*3/uL (ref 0.0–0.7)
Eosinophils Relative: 0.8 % (ref 0.0–5.0)
HCT: 40 % (ref 36.0–46.0)
Hemoglobin: 13.4 g/dL (ref 12.0–15.0)
Lymphocytes Relative: 26.3 % (ref 12.0–46.0)
Lymphs Abs: 2 10*3/uL (ref 0.7–4.0)
MCHC: 33.4 g/dL (ref 30.0–36.0)
MCV: 86.8 fl (ref 78.0–100.0)
Monocytes Absolute: 0.6 10*3/uL (ref 0.1–1.0)
Monocytes Relative: 7.7 % (ref 3.0–12.0)
Neutro Abs: 5 10*3/uL (ref 1.4–7.7)
Neutrophils Relative %: 64.5 % (ref 43.0–77.0)
Platelets: 247 10*3/uL (ref 150.0–400.0)
RBC: 4.61 Mil/uL (ref 3.87–5.11)
RDW: 15.4 % (ref 11.5–15.5)
WBC: 7.7 10*3/uL (ref 4.0–10.5)

## 2018-07-27 LAB — LIPID PANEL
CHOL/HDL RATIO: 2
Cholesterol: 193 mg/dL (ref 0–200)
HDL: 80.2 mg/dL (ref >39.00)
LDL CALC: 94 mg/dL (ref 0–99)
NONHDL: 112.97
Triglycerides: 93 mg/dL (ref 0.0–149.0)
VLDL: 18.6 mg/dL (ref 0.0–40.0)

## 2018-07-27 LAB — HIV ANTIBODY (ROUTINE TESTING W REFLEX): HIV 1&2 Ab, 4th Generation: NONREACTIVE

## 2018-07-27 LAB — TSH: TSH: 15.58 u[IU]/mL — AB (ref 0.35–4.50)

## 2018-07-27 NOTE — Telephone Encounter (Signed)
Copied from CRM [id number]. Topic: General - Other >> Jul 26, 2018  4:57 PM Mcneil, Ja-Kwan wrote: Reason for CRM: Pt called in to advise that she does not want to have the HIV screening.

## 2018-07-30 ENCOUNTER — Other Ambulatory Visit: Payer: Self-pay | Admitting: Family Medicine

## 2018-07-30 DIAGNOSIS — N760 Acute vaginitis: Principal | ICD-10-CM

## 2018-07-30 DIAGNOSIS — B9689 Other specified bacterial agents as the cause of diseases classified elsewhere: Secondary | ICD-10-CM

## 2018-07-30 LAB — CYTOLOGY - PAP
Bacterial vaginitis: POSITIVE — AB
CANDIDA VAGINITIS: NEGATIVE
CHLAMYDIA, DNA PROBE: NEGATIVE
Diagnosis: NEGATIVE
HPV (WINDOPATH): NOT DETECTED
Neisseria Gonorrhea: NEGATIVE
TRICH (WINDOWPATH): NEGATIVE

## 2018-07-30 MED ORDER — LEVOTHYROXINE SODIUM 150 MCG PO TABS: 150.0000 ug | ORAL_TABLET | Freq: Every day | ORAL | 3 refills | Status: DC

## 2018-07-30 MED ORDER — METRONIDAZOLE 500 MG PO TABS: 500.0000 mg | ORAL_TABLET | Freq: Two times a day (BID) | ORAL | 0 refills | Status: DC

## 2018-07-30 NOTE — Telephone Encounter (Signed)
It looks like Erin [name] ordered HIV and was resulted when patient made this call.  She told me she did not want it.  I did not order it.  Not sure why it was ordered.   How can we maybe help patient?

## 2018-07-31 ENCOUNTER — Other Ambulatory Visit: Payer: Self-pay | Admitting: Family Medicine

## 2018-07-31 NOTE — Telephone Encounter (Signed)
Author phoned pt. to review BV results and new flagyl sent in by Dr. Laury AxonLowne 11/19. Pt. Verbalized understanding, and requested diflucan to be sent in as well.    Author inquired into why pt. did not want HIV testing, and pt. stated "it was an anxiety thing", but pt. already viewed her results in [facility], and stated she "was good" with getting tested after all. No other questions or concerns at this time.

## 2018-08-06 ENCOUNTER — Ambulatory Visit (INDEPENDENT_AMBULATORY_CARE_PROVIDER_SITE_OTHER): Payer: BC Managed Care – PPO | Admitting: Bariatrics

## 2018-08-24 ENCOUNTER — Ambulatory Visit: Payer: Self-pay

## 2018-08-24 NOTE — Telephone Encounter (Signed)
Spoke with patient and advised her to be seen at the Er per Dr. Laury AxonLowne. Dr. Laury AxonLowne stated with the heavy bleeding she is currently having she needed to be seen. Patient stated "I have a party to go to tonight, I have spent $2,000 dollars on this party and this is going to ruin my day, and I have to go to work" I then advised patient to go to an Urgent care she stated "They have a high A** co-pay too" She then states " I though yall can just give me some tylenol to stop the bleeding" She was advised that tylenol does not stop bleeding and she really needed to be seen. Patient refused to be seen stated "I can go tomorrow, I just cant go today"

## 2018-08-24 NOTE — Telephone Encounter (Signed)
Pt c/o severe vaginal bleeding. Pt is changing her tampon 2 times per hour and is passing large golf ball sized clots. Bleeding began Monday. Pt having mild menstrual type cramping. Pt c/o weakness. Pt advised that she will need to go to ED by her PCP, pt refused. [facility] asked for the call to be transferred to her. Call transferred. Disposition unknown.  Reason for Disposition . SEVERE vaginal bleeding (i.e., soaking 2 pads or tampons per hour and present 2 or more hours; 1 menstrual cup every 2 hours)  Answer Assessment - Initial Assessment Questions 1. AMOUNT: "Describe the bleeding that you are having."    - SPOTTING: spotting, or pinkish / brownish mucous discharge; does not fill panti-liner or pad    - MILD:  less than 1 pad / hour; less than patient's usual menstrual bleeding   - MODERATE: 1-2 pads / hour; 1 menstrual cup every 6 hours; small-medium blood clots (e.g., pea, grape, small coin)   - SEVERE: soaking 2 or more pads/hour for 2 or more hours; 1 menstrual cup every 2 hours; bleeding not contained by pads or continuous red blood from vagina; large blood clots (e.g., golf ball, large coin)      severe 2. ONSET: "When did the bleeding begin?" "Is it continuing now?"     Monday- yes 3. MENSTRUAL PERIOD: "When was the last normal menstrual period?" "How is this different than your period?"     3 weeks ago Nov 19th 4. REGULARITY: "How regular are your periods?"     30 days 5. ABDOMINAL PAIN: "Do you have any pain?" "How bad is the pain?"  (e.g., Scale 1-10; mild, moderate, or severe)   - MILD (1-3): doesn't interfere with normal activities, abdomen soft and not tender to touch    - MODERATE (4-7): interferes with normal activities or awakens from sleep, tender to touch    - SEVERE (8-10): excruciating pain, doubled over, unable to do any normal activities      mild 6. PREGNANCY: "Could you be pregnant?" "Are you sexually active?" "Did you recently give birth?"     no 7.  BREASTFEEDING: "Are you breastfeeding?"     no 8. HORMONES: "Are you taking any hormone medications, prescription or OTC?" (e.g., birth control pills, estrogen)     BCP 9. BLOOD THINNERS: "Do you take any blood thinners?" (e.g., Coumadin/warfarin, Pradaxa/dabigatran, aspirin)     no 10. CAUSE: "What do you think is causing the bleeding?" (e.g., recent gyn surgery, recent gyn procedure; known bleeding disorder, cervical cancer, polycystic ovarian disease, fibroids)         BCP 11. HEMODYNAMIC STATUS: "Are you weak or feeling lightheaded?" If so, ask: "Can you stand and walk normally?"        Weak - yes 12. OTHER SYMPTOMS: "What other symptoms are you having with the bleeding?" (e.g., passed tissue, vaginal discharge, fever, menstrual-type cramps)       Passes large clot, menstrual type cramps  Protocols used: VAGINAL BLEEDING - ABNORMAL-A-AH

## 2018-08-30 NOTE — Telephone Encounter (Signed)
Spoke with pt. She states she is on another line and will need to call us back. Call was disconnected.

## 2018-08-30 NOTE — Telephone Encounter (Signed)
It think Princess talked with you about her, but I just wanted you to see this message.

## 2018-08-30 NOTE — Telephone Encounter (Signed)
She did and pt said she did not want to go to the ER and she had no time for an appointment  Please call and make sure she is ok-- does she need a gyn referral

## 2018-12-03 ENCOUNTER — Ambulatory Visit (INDEPENDENT_AMBULATORY_CARE_PROVIDER_SITE_OTHER): Payer: BC Managed Care – PPO | Admitting: Family Medicine

## 2018-12-03 ENCOUNTER — Other Ambulatory Visit: Payer: Self-pay | Admitting: Family Medicine

## 2018-12-03 ENCOUNTER — Other Ambulatory Visit: Payer: Self-pay

## 2018-12-03 ENCOUNTER — Encounter: Payer: Self-pay | Admitting: Family Medicine

## 2018-12-03 VITALS — Temp 98.6°F | Resp 12 | Wt 210.0 lb

## 2018-12-03 DIAGNOSIS — J0140 Acute pansinusitis, unspecified: Secondary | ICD-10-CM

## 2018-12-03 MED ORDER — AMOXICILLIN-POT CLAVULANATE 875-125 MG PO TABS: 1.0000 | ORAL_TABLET | Freq: Two times a day (BID) | ORAL | 0 refills | Status: DC

## 2018-12-03 MED ORDER — FLUCONAZOLE 150 MG PO TABS: ORAL_TABLET | ORAL | 0 refills | Status: DC

## 2018-12-03 MED ORDER — FLUTICASONE PROPIONATE 50 MCG/ACT NA SUSP: 2.0000 | Freq: Every day | NASAL | 6 refills | Status: DC

## 2018-12-03 NOTE — Progress Notes (Signed)
Virtual Visit via Telephone Note  I connected with Erin [name] on 12/03/18 at  1:30 PM EDT by telephone and verified that I am speaking with the correct person using two identifiers.   I discussed the limitations, risks, security and privacy concerns of performing an evaluation and management service by telephone and the availability of in person appointments. I also discussed with the patient that there may be a patient responsible charge related to this service. The patient expressed understanding and agreed to proceed.   History of Present Illness:  Sinus pressure, headache and congestion with body aches since Saturday.  No fevers-- her temp is checked everyday on arrival to school .  She is taking alka seltzer plus with no relief   No cough    Observations/Objective: No fever Pt is no distress---no sob    Assessment and Plan: Sinusitis     Augmentin and flonase sent to pharmacy on [address] Diflucan also sent at pt request  Pt advised to call back or come office if symptoms do not improve  Follow Up Instructions:    I discussed the assessment and treatment plan with the patient. The patient was provided an opportunity to ask questions and all were answered. The patient agreed with the plan and demonstrated an understanding of the instructions.   The patient was advised to call back or seek an in-person evaluation if the symptoms worsen or if the condition fails to improve as anticipated.  I provided 10 minutes of non-face-to-face time during this encounter.   Donato Schultz, DO

## 2018-12-03 NOTE — Patient Instructions (Signed)
Sinusitis, Adult  Sinusitis is inflammation of your sinuses. Sinuses are hollow spaces in the bones around your face. Your sinuses are located:   Around your eyes.   In the middle of your forehead.   Behind your nose.   In your cheekbones.  Mucus normally drains out of your sinuses. When your nasal tissues become inflamed or swollen, mucus can become trapped or blocked. This allows bacteria, viruses, and fungi to grow, which leads to infection. Most infections of the sinuses are caused by a virus.  Sinusitis can develop quickly. It can last for up to 4 weeks (acute) or for more than 12 weeks (chronic). Sinusitis often develops after a cold.  What are the causes?  This condition is caused by anything that creates swelling in the sinuses or stops mucus from draining. This includes:   Allergies.   Asthma.   Infection from bacteria or viruses.   Deformities or blockages in your nose or sinuses.   Abnormal growths in the nose (nasal polyps).   Pollutants, such as chemicals or irritants in the air.   Infection from fungi (rare).  What increases the risk?  You are more likely to develop this condition if you:   Have a weak body defense system (immune system).   Do a lot of swimming or diving.   Overuse nasal sprays.   Smoke.  What are the signs or symptoms?  The main symptoms of this condition are pain and a feeling of pressure around the affected sinuses. Other symptoms include:   Stuffy nose or congestion.   Thick drainage from your nose.   Swelling and warmth over the affected sinuses.   Headache.   Upper toothache.   A cough that may get worse at night.   Extra mucus that collects in the throat or the back of the nose (postnasal drip).   Decreased sense of smell and taste.   Fatigue.   A fever.   Sore throat.   Bad breath.  How is this diagnosed?  This condition is diagnosed based on:   Your symptoms.   Your medical history.   A physical exam.   Tests to find out if your condition is  acute or chronic. This may include:  ? Checking your nose for nasal polyps.  ? Viewing your sinuses using a device that has a light (endoscope).  ? Testing for allergies or bacteria.  ? Imaging tests, such as an MRI or CT scan.  In rare cases, a bone biopsy may be done to rule out more serious types of fungal sinus disease.  How is this treated?  Treatment for sinusitis depends on the cause and whether your condition is chronic or acute.   If caused by a virus, your symptoms should go away on their own within 10 days. You may be given medicines to relieve symptoms. They include:  ? Medicines that shrink swollen nasal passages (topical intranasal decongestants).  ? Medicines that treat allergies (antihistamines).  ? A spray that eases inflammation of the nostrils (topical intranasal corticosteroids).  ? Rinses that help get rid of thick mucus in your nose (nasal saline washes).   If caused by bacteria, your health care provider may recommend waiting to see if your symptoms improve. Most bacterial infections will get better without antibiotic medicine. You may be given antibiotics if you have:  ? A severe infection.  ? A weak immune system.   If caused by narrow nasal passages or nasal polyps, you may need   to have surgery.  Follow these instructions at home:  Medicines   Take, use, or apply over-the-counter and prescription medicines only as told by your health care provider. These may include nasal sprays.   If you were prescribed an antibiotic medicine, take it as told by your health care provider. Do not stop taking the antibiotic even if you start to feel better.  Hydrate and humidify     Drink enough fluid to keep your urine pale yellow. Staying hydrated will help to thin your mucus.   Use a cool mist humidifier to keep the humidity level in your home above 50%.   Inhale steam for 10-15 minutes, 3-4 times a day, or as told by your health care provider. You can do this in the bathroom while a hot shower is  running.   Limit your exposure to cool or dry air.  Rest   Rest as much as possible.   Sleep with your head raised (elevated).   Make sure you get enough sleep each night.  General instructions     Apply a warm, moist washcloth to your face 3-4 times a day or as told by your health care provider. This will help with discomfort.   Wash your hands often with soap and water to reduce your exposure to germs. If soap and water are not available, use hand sanitizer.   Do not smoke. Avoid being around people who are smoking (secondhand smoke).   Keep all follow-up visits as told by your health care provider. This is important.  Contact a health care provider if:   You have a fever.   Your symptoms get worse.   Your symptoms do not improve within 10 days.  Get help right away if:   You have a severe headache.   You have persistent vomiting.   You have severe pain or swelling around your face or eyes.   You have vision problems.   You develop confusion.   Your neck is stiff.   You have trouble breathing.  Summary   Sinusitis is soreness and inflammation of your sinuses. Sinuses are hollow spaces in the bones around your face.   This condition is caused by nasal tissues that become inflamed or swollen. The swelling traps or blocks the flow of mucus. This allows bacteria, viruses, and fungi to grow, which leads to infection.   If you were prescribed an antibiotic medicine, take it as told by your health care provider. Do not stop taking the antibiotic even if you start to feel better.   Keep all follow-up visits as told by your health care provider. This is important.  This information is not intended to replace advice given to you by your health care provider. Make sure you discuss any questions you have with your health care provider.  Document Released: 08/29/2005 Document Revised: 01/29/2018 Document Reviewed: 01/29/2018  Elsevier Interactive Patient Education  2019 [employer]

## 2018-12-06 ENCOUNTER — Telehealth: Payer: Self-pay | Admitting: Family Medicine

## 2018-12-06 ENCOUNTER — Ambulatory Visit: Payer: Self-pay

## 2018-12-06 MED ORDER — PREDNISONE 10 MG PO TABS: ORAL_TABLET | ORAL | 0 refills | Status: DC

## 2018-12-06 NOTE — Telephone Encounter (Signed)
Prednisone sent in while patient on phone.

## 2018-12-06 NOTE — Telephone Encounter (Signed)
We can not do the injection because we can not bring anyone in with resp symptoms We can add a pred taper if she likes ---

## 2018-12-06 NOTE — Telephone Encounter (Signed)
Copied from CRM [id number]. Topic: Quick Communication - Rx Refill/Question >> Dec 06, 2018 11:15 AM Crist Infante wrote: Medication: prednisone taper Pt calling back to follow up on request for something else  pt aware no pts with resp sx in the office.  Pt is agreeable to try the pred taper advised by Dr Reynolds Bowl.  (see triage note)  [id number] - [place], [place] - [address] [phone] (Phone) [phone] (Fax)

## 2018-12-06 NOTE — Telephone Encounter (Signed)
Prednisone sent in.

## 2018-12-06 NOTE — Telephone Encounter (Signed)
Incoming call from Patient Stating that she is having severe pain and pressure in the back of her head and neck.  Rates it an 8.  During the call rated a 6, After taking a tylenol.  Worse at night.   Related to laying down.  Patient states that she needs some relief.  She can breathe through nose.  Just needs relief. She is still taking antibiotic. States nasal discharge feels stuck.   Denies fever, but dosent know for sure hasn't taken temperature .    States once she received an injection wonders if that could be an option.  Patient request a return call. [phone]. Patient awaits return phone call    1  Erin [name] Female, 38 y.o., 1980/12/21 MRN:  [id number] Phone:  [phone] Judie Petit) PCP:  Zola Button, Grayling Congress, DO Coverage:  [employer] Message from Tonita Phoenix sent at 12/06/2018 8:06 AM EDT   Summary: Continuous sinus pressure   Pt stated she had an Evisit on 12/03/18 regarding her sinuses. She is still taking the antibiotics prescribed and all of her symptoms have gone away except for the pressure. Pt stated it keeps her p at night and she can't function. She would like to know what she should do next. Please advise. [phone]        Call History    Type Contact Phone  12/06/2018 08:03 AM Phone (Incoming) [name], [name] (Self) [phone] (H)  User: Lynne Logan D   Reason for Disposition . [1] Sinus pain (not just congestion) AND [2] fever  Answer Assessment - Initial Assessment Questions 1. LOCATION: "Where does it hurt?"      Back of head , neck  2. ONSET: "When did the sinus pain start?"  (e.g., hours, days)      Saturday3. SEVERITY: "How bad is the pain?"   (Scale 1-10; mild, moderate or severe)   - MILD (1-3): doesn't interfere with normal activities    - MODERATE (4-7): interferes with normal activities (e.g., work or school) or awakens from sleep   - SEVERE (8-10): excruciating pain and patient unable to do any normal  activities        Last night 8, now  4. RECURRENT SYMPTOM: "Have you ever had sinus problems before?" If so, ask: "When was the last time?" and "What happened that time?"      *yes5. NASAL CONGESTION: "Is the nose blocked?" If so, ask, "Can you open it or must you breathe through the mouth?"    Not really 6. NASAL DISCHARGE: "Do you have discharge from your nose?" If so ask, "What color?"    Stuck  7. FEVER: "Do you have a fever?" If so, ask: "What is it, how was it measured, and when did it start?"      dont feel like  8. OTHER SYMPTOMS: "Do you have any other symptoms?" (e.g., sore throat, cough, earache, difficulty breathing)     Ears are starting to bother  9. PREGNANCY: "Is there any chance you are pregnant?" "When was your last menstrual period?"  Protocols used: SINUS PAIN OR CONGESTION-A-AH

## 2018-12-06 NOTE — Telephone Encounter (Signed)
Patient calling to check the status of getting the medication sent to the pharmacy. Advised we are still waiting on the doctor to send the medication in. Informed her that Dr Laury Axon is currently with patient's so it might take some time to get this done. Patient states "well I'll just be in pain then."

## 2019-01-01 ENCOUNTER — Other Ambulatory Visit: Payer: Self-pay | Admitting: *Deleted

## 2019-01-01 DIAGNOSIS — J0110 Acute frontal sinusitis, unspecified: Secondary | ICD-10-CM

## 2019-01-01 MED ORDER — LEVOCETIRIZINE DIHYDROCHLORIDE 5 MG PO TABS: 5.0000 mg | ORAL_TABLET | Freq: Every evening | ORAL | 5 refills | Status: DC

## 2019-01-08 ENCOUNTER — Other Ambulatory Visit: Payer: Self-pay | Admitting: Family Medicine

## 2019-05-28 ENCOUNTER — Other Ambulatory Visit: Payer: Self-pay | Admitting: Family Medicine

## 2019-06-21 ENCOUNTER — Ambulatory Visit: Payer: BC Managed Care – PPO | Admitting: Family Medicine

## 2019-08-02 ENCOUNTER — Telehealth: Payer: Self-pay | Admitting: *Deleted

## 2019-08-02 NOTE — Telephone Encounter (Signed)
Requesting call back, pt says she does not have her voicemail set up.

## 2019-08-02 NOTE — Telephone Encounter (Signed)
Left detailed message on machine and to call back if she has questions or concerns.

## 2019-08-02 NOTE — Telephone Encounter (Signed)
Copied from [place] [id number]. Topic: General - Other >> Jul 31, 2019 12:17 PM Rainey Pines A wrote: Patient would like a callback from nurse in regards to taking sea moss to treat thyroid disorder. Patient stated that she takes synthroid  and wants to know if its okay to take sea moss. Please advise.

## 2019-08-02 NOTE — Telephone Encounter (Signed)
Do not take it in place of synthroid-----

## 2019-08-02 NOTE — Telephone Encounter (Signed)
Patient notified of note.  She stated that she did not want to replace synthroid but just wanted to know if it can cause iodine poisoning,  It is rich in iodine.  She will discuss at appointment in January.

## 2019-09-24 ENCOUNTER — Other Ambulatory Visit: Payer: Self-pay

## 2019-09-24 ENCOUNTER — Encounter: Payer: Self-pay | Admitting: Family Medicine

## 2019-09-24 ENCOUNTER — Ambulatory Visit (INDEPENDENT_AMBULATORY_CARE_PROVIDER_SITE_OTHER): Payer: BC Managed Care – PPO | Admitting: Family Medicine

## 2019-09-24 ENCOUNTER — Telehealth: Payer: Self-pay | Admitting: Family Medicine

## 2019-09-24 VITALS — Temp 98.6°F | Ht 69.0 in | Wt 219.0 lb

## 2019-09-24 DIAGNOSIS — J069 Acute upper respiratory infection, unspecified: Secondary | ICD-10-CM | POA: Diagnosis not present

## 2019-09-24 MED ORDER — LEVOCETIRIZINE DIHYDROCHLORIDE 5 MG PO TABS: 5.0000 mg | ORAL_TABLET | Freq: Every evening | ORAL | 5 refills | Status: AC

## 2019-09-24 NOTE — Telephone Encounter (Signed)
Copied from CRM [id number]. Topic: General - Inquiry >> Sep 24, 2019  1:55 PM Baldo Daub L wrote: Reason for CRM:   Pt states she forgot to ask at her virtual visit, but she needs a work note.

## 2019-09-24 NOTE — Progress Notes (Signed)
Virtual Visit via Video Note  I connected with Erin [name] on 09/24/19 at  1:40 PM EST by a video enabled telemedicine application and verified that I am speaking with the correct person using two identifiers.  Location: Patient: home  Provider: home    I discussed the limitations of evaluation and management by telemedicine and the availability of in person appointments. The patient expressed understanding and agreed to proceed.  History of Present Illness: Pt is home feeling bad.  + sneezing , + fatigue, diarrha and nasal congestion   +symptoms x 1 week  Pt has mucinex D  that she has taken   Past Medical History:  Diagnosis Date  . Abortion history    x3  . Asthma   . Elevated blood pressure   . Hypertension    Current Outpatient Medications on File Prior to Visit  Medication Sig Dispense Refill  . fluticasone (FLONASE) 50 MCG/ACT nasal spray Place 2 sprays into both nostrils daily. 16 g 6  . fluticasone (FLONASE) 50 MCG/ACT nasal spray Place 2 sprays into both nostrils daily. 16 g 6  . levocetirizine (XYZAL) 5 MG tablet Take 1 tablet (5 mg total) by mouth every evening. 30 tablet 5  . Multiple Vitamin (MULTIVITAMIN) tablet Take 1 tablet by mouth daily.      . Norethindrone-Ethinyl Estradiol-Fe Biphas (LO LOESTRIN FE) 1 MG-10 MCG / 10 MCG tablet Take 1 tablet by mouth daily. 1 Package 11  . SYNTHROID 150 MCG tablet TAKE 1 TABLET BY MOUTH DAILY BEFORE BREAKFAST 90 tablet 1  . Vitamin D, Ergocalciferol, (DRISDOL) 1.25 MG (50000 UT) CAPS capsule TAKE 1 CAPSULE BY MOUTH EVERY 7 DAYS. 12 capsule 1   No current facility-administered medications on file prior to visit.   Allergies  Allergen Reactions  . Shellfish Allergy Anaphylaxis  . Sulfonamide Derivatives     Observations/Objective: Vitals:   09/24/19 1342  Temp: 98.6 F (37 C)   Pt is in NAd   Assessment and Plan: 1. Viral upper respiratory tract infection con't mucinex and flonase Add xyzal Pt will get covid  test  - levocetirizine (XYZAL) 5 MG tablet; Take 1 tablet (5 mg total) by mouth every evening.  Dispense: 30 tablet; Refill: 5   Follow Up Instructions:    I discussed the assessment and treatment plan with the patient. The patient was provided an opportunity to ask questions and all were answered. The patient agreed with the plan and demonstrated an understanding of the instructions.   The patient was advised to call back or seek an in-person evaluation if the symptoms worsen or if the condition fails to improve as anticipated.     Donato Schultz, DO

## 2019-09-25 ENCOUNTER — Ambulatory Visit: Payer: BC Managed Care – PPO | Attending: Internal Medicine

## 2019-09-25 DIAGNOSIS — Z20822 Contact with and (suspected) exposure to covid-19: Secondary | ICD-10-CM

## 2019-09-25 NOTE — Telephone Encounter (Signed)
How long can I write the pt out for? Was she need COVID testing?

## 2019-09-25 NOTE — Telephone Encounter (Signed)
Note is in mychart

## 2019-09-27 LAB — NOVEL CORONAVIRUS, NAA: SARS-CoV-2, NAA: NOT DETECTED

## 2019-10-08 ENCOUNTER — Other Ambulatory Visit (HOSPITAL_BASED_OUTPATIENT_CLINIC_OR_DEPARTMENT_OTHER): Payer: Self-pay | Admitting: Family Medicine

## 2019-10-08 ENCOUNTER — Other Ambulatory Visit: Payer: Self-pay

## 2019-10-08 ENCOUNTER — Other Ambulatory Visit (HOSPITAL_COMMUNITY)
Admission: RE | Admit: 2019-10-08 | Discharge: 2019-10-08 | Disposition: A | Discharge status: Home / Self Care | Payer: BC Managed Care – PPO | Source: Ambulatory Visit | Attending: Family Medicine | Admitting: Family Medicine

## 2019-10-08 ENCOUNTER — Ambulatory Visit (INDEPENDENT_AMBULATORY_CARE_PROVIDER_SITE_OTHER): Payer: BC Managed Care – PPO | Admitting: Family Medicine

## 2019-10-08 VITALS — BP 130/90 | HR 102 | Temp 97.9°F | Ht 69.0 in | Wt 219.4 lb

## 2019-10-08 DIAGNOSIS — E559 Vitamin D deficiency, unspecified: Secondary | ICD-10-CM

## 2019-10-08 DIAGNOSIS — Z0000 Encounter for general adult medical examination without abnormal findings: Secondary | ICD-10-CM

## 2019-10-08 DIAGNOSIS — Z975 Presence of (intrauterine) contraceptive device: Secondary | ICD-10-CM | POA: Diagnosis not present

## 2019-10-08 DIAGNOSIS — Z7251 High risk heterosexual behavior: Secondary | ICD-10-CM | POA: Insufficient documentation

## 2019-10-08 DIAGNOSIS — E039 Hypothyroidism, unspecified: Secondary | ICD-10-CM

## 2019-10-08 DIAGNOSIS — R8290 Unspecified abnormal findings in urine: Secondary | ICD-10-CM

## 2019-10-08 DIAGNOSIS — Z1231 Encounter for screening mammogram for malignant neoplasm of breast: Secondary | ICD-10-CM

## 2019-10-08 LAB — POC URINALSYSI DIPSTICK (AUTOMATED)
Bilirubin, UA: NEGATIVE
Glucose, UA: NEGATIVE
Ketones, UA: NEGATIVE
Leukocytes, UA: NEGATIVE
Nitrite, UA: NEGATIVE
Protein, UA: NEGATIVE
Spec Grav, UA: 1.02 (ref 1.010–1.025)
Urobilinogen, UA: 0.2 E.U./dL
pH, UA: 7 (ref 5.0–8.0)

## 2019-10-08 LAB — POCT URINE PREGNANCY: Preg Test, Ur: NEGATIVE

## 2019-10-08 MED ORDER — VITAMIN D (ERGOCALCIFEROL) 1.25 MG (50000 UNIT) PO CAPS: ORAL_CAPSULE | ORAL | 1 refills | Status: DC

## 2019-10-08 NOTE — Patient Instructions (Signed)

## 2019-10-08 NOTE — Progress Notes (Signed)
Subjective:     Erin [name] is a 39 y.o. female and is here for a comprehensive physical exam. The patient reports no new complaints.  she needs labs and f/u thyroid as well as std check. She would like to try different birth control.  She is interested in IUD  Social History   Socioeconomic History  . Marital status: Single    Spouse name: n/a  . Number of children: 0  . Years of education: Master's  . Highest education level: Not on file  Occupational History  . Occupation: Science writer: Programmer, applications  Tobacco Use  . Smoking status: Never Smoker  . Smokeless tobacco: Never Used  Substance and Sexual Activity  . Alcohol use: Yes    Alcohol/week: 3.0 standard drinks    Types: 3 Standard drinks or equivalent per week  . Drug use: No  . Sexual activity: Yes    Partners: Male    Birth control/protection: Condom  Other Topics Concern  . Not on file  Social History Narrative   Working on her doctoral degree.  Lives alone.   Social Determinants of Health   Financial Resource Strain:   . Difficulty of Paying Living Expenses: Not on file  Food Insecurity:   . Worried About Charity fundraiser in the Last Year: Not on file  . Ran Out of Food in the Last Year: Not on file  Transportation Needs:   . Lack of Transportation (Medical): Not on file  . Lack of Transportation (Non-Medical): Not on file  Physical Activity:   . Days of Exercise per Week: Not on file  . Minutes of Exercise per Session: Not on file  Stress:   . Feeling of Stress : Not on file  Social Connections:   . Frequency of Communication with Friends and Family: Not on file  . Frequency of Social Gatherings with Friends and Family: Not on file  . Attends Religious Services: Not on file  . Active Member of Clubs or Organizations: Not on file  . Attends Archivist Meetings: Not on file  . Marital Status: Not on file  Intimate Partner Violence:   . Fear of Current or Ex-Partner: Not  on file  . Emotionally Abused: Not on file  . Physically Abused: Not on file  . Sexually Abused: Not on file   Health Maintenance  Topic Date Due  . PAP SMEAR-Modifier  07/26/2021  . TETANUS/TDAP  07/26/2028  . INFLUENZA VACCINE  Completed  . HIV Screening  Completed    The following portions of the patient's history were reviewed and updated as appropriate:  She  has a past medical history of Abortion history, Asthma, Elevated blood pressure, and Hypertension. She does not have any pertinent problems on file. She  has a past surgical history that includes Induced abortion; Breast surgery; and Thyroidectomy (10/30/08). Her family history includes Coronary artery disease in her father; Diabetes in her maternal aunt; Hypertension in her mother; Lung cancer in her unknown relative; Stroke in her father. She  reports that she has never smoked. She has never used smokeless tobacco. She reports current alcohol use of about 3.0 standard drinks of alcohol per week. She reports that she does not use drugs. She has a current medication list which includes the following prescription(s): fluticasone, fluticasone, levocetirizine, levocetirizine, multivitamin, norethindrone-ethinyl estradiol-fe biphas, synthroid, and vitamin d (ergocalciferol). Current Outpatient Medications on File Prior to Visit  Medication Sig Dispense Refill  . fluticasone (  FLONASE) 50 MCG/ACT nasal spray Place 2 sprays into both nostrils daily. 16 g 6  . fluticasone (FLONASE) 50 MCG/ACT nasal spray Place 2 sprays into both nostrils daily. 16 g 6  . levocetirizine (XYZAL) 5 MG tablet Take 1 tablet (5 mg total) by mouth every evening. 30 tablet 5  . levocetirizine (XYZAL) 5 MG tablet Take 1 tablet (5 mg total) by mouth every evening. 30 tablet 5  . Multiple Vitamin (MULTIVITAMIN) tablet Take 1 tablet by mouth daily.      . Norethindrone-Ethinyl Estradiol-Fe Biphas (LO LOESTRIN FE) 1 MG-10 MCG / 10 MCG tablet Take 1 tablet by mouth  daily. 1 Package 11  . SYNTHROID 150 MCG tablet TAKE 1 TABLET BY MOUTH DAILY BEFORE BREAKFAST 90 tablet 1   No current facility-administered medications on file prior to visit.   She is allergic to shellfish allergy and sulfonamide derivatives..  Review of Systems Review of Systems  Constitutional: Negative for activity change, appetite change and fatigue.  HENT: Negative for hearing loss, congestion, tinnitus and ear discharge.  dentist q64m Eyes: Negative for visual disturbance (see optho q1y -- vision corrected to 20/20 with glasses).  Respiratory: Negative for cough, chest tightness and shortness of breath.   Cardiovascular: Negative for chest pain, palpitations and leg swelling.  Gastrointestinal: Negative for abdominal pain, diarrhea, constipation and abdominal distention.  Genitourinary: Negative for urgency, frequency, decreased urine volume and difficulty urinating.  Musculoskeletal: Negative for back pain, arthralgias and gait problem.  Skin: Negative for color change, pallor and rash.  Neurological: Negative for dizziness, light-headedness, numbness and headaches.  Hematological: Negative for adenopathy. Does not bruise/bleed easily.  Psychiatric/Behavioral: Negative for suicidal ideas, confusion, sleep disturbance, self-injury, dysphoric mood, decreased concentration and agitation.       Objective:    BP 130/90 (BP Location: Left Arm, Patient Position: Sitting, Cuff Size: Large)   Pulse (!) 102   Temp 97.9 F (36.6 C) (Temporal)   Ht 5\' 9"  (1.753 m)   Wt 219 lb 6.4 oz (99.5 kg)   LMP 09/14/2019   SpO2 98%   BMI 32.40 kg/m  General appearance: alert, cooperative, appears stated age and no distress Head: Normocephalic, without obvious abnormality, atraumatic Eyes: negative findings: lids and lashes normal, conjunctivae and sclerae normal, corneas clear and pupils equal, round, reactive to light and accomodation Ears: normal TM's and external ear canals both  ears Neck: no adenopathy, no carotid bruit, no JVD, supple, symmetrical, trachea midline and thyroid not enlarged, symmetric, no tenderness/mass/nodules Back: symmetric, no curvature. ROM normal. No CVA tenderness. Lungs: clear to auscultation bilaterally Breasts: normal appearance, no masses or tenderness Heart: regular rate and rhythm, S1, S2 normal, no murmur, click, rub or gallop Abdomen: soft, non-tender; bowel sounds normal; no masses,  no organomegaly Pelvic: deferred --gyn Extremities: extremities normal, atraumatic, no cyanosis or edema Pulses: 2+ and symmetric Skin: Skin color, texture, turgor normal. No rashes or lesions Lymph nodes: Cervical, supraclavicular, and axillary nodes normal. Neurologic: Alert and oriented X 3, normal strength and tone. Normal symmetric reflexes. Normal coordination and gait    Assessment:    Healthy female exam.      Plan:     ghm utd Check labs  See After Visit Summary for Counseling Recommendations    1. Vitamin D deficiency Check labs  - Vitamin D, Ergocalciferol, (DRISDOL) 1.25 MG (50000 UNIT) CAPS capsule; TAKE 1 CAPSULE BY MOUTH EVERY 7 DAYS.  Dispense: 12 capsule; Refill: 1 - Vitamin D (25 hydroxy)  2. Contraception,  device intrauterine Pt is interested in IUD  - Ambulatory referral to Obstetrics / Gynecology  3. Preventative health care See above - TSH - Lipid panel - CBC with Differential/Platelet - Comprehensive metabolic panel - Vitamin D (25 hydroxy)  4. Hypothyroidism, unspecified type Check labs con't meds  - TSH  5. High risk heterosexual behavior Pt is requesting std check -- no symptoms  - Urine cytology ancillary only - HIV Antibody (routine testing w rflx) - RPR - Hepatitis B surface antigen - Hepatitis C antibody - POCT Urinalysis Dipstick (Automated) - POCT urine pregnancy  6. Abnormal urine finding   - Urine Culture

## 2019-10-09 ENCOUNTER — Encounter: Payer: Self-pay | Admitting: Family Medicine

## 2019-10-09 LAB — CBC WITH DIFFERENTIAL/PLATELET
Basophils Absolute: 0.1 10*3/uL (ref 0.0–0.1)
Basophils Relative: 0.7 % (ref 0.0–3.0)
Eosinophils Absolute: 0.1 10*3/uL (ref 0.0–0.7)
Eosinophils Relative: 1.1 % (ref 0.0–5.0)
HCT: 39.1 % (ref 36.0–46.0)
Hemoglobin: 13.1 g/dL (ref 12.0–15.0)
Lymphocytes Relative: 25.5 % (ref 12.0–46.0)
Lymphs Abs: 2.3 10*3/uL (ref 0.7–4.0)
MCHC: 33.4 g/dL (ref 30.0–36.0)
MCV: 85.9 fl (ref 78.0–100.0)
Monocytes Absolute: 0.8 10*3/uL (ref 0.1–1.0)
Monocytes Relative: 9 % (ref 3.0–12.0)
Neutro Abs: 5.7 10*3/uL (ref 1.4–7.7)
Neutrophils Relative %: 63.7 % (ref 43.0–77.0)
Platelets: 235 10*3/uL (ref 150.0–400.0)
RBC: 4.55 Mil/uL (ref 3.87–5.11)
RDW: 16.2 % — ABNORMAL HIGH (ref 11.5–15.5)
WBC: 8.9 10*3/uL (ref 4.0–10.5)

## 2019-10-09 LAB — COMPREHENSIVE METABOLIC PANEL
ALT: 11 U/L (ref 0–35)
AST: 18 U/L (ref 0–37)
Albumin: 4.4 g/dL (ref 3.5–5.2)
Alkaline Phosphatase: 54 U/L (ref 39–117)
BUN: 14 mg/dL (ref 6–23)
CO2: 26 mEq/L (ref 19–32)
Calcium: 9.6 mg/dL (ref 8.4–10.5)
Chloride: 104 mEq/L (ref 96–112)
Creatinine, Ser: 0.96 mg/dL (ref 0.40–1.20)
GFR: 77.76 mL/min (ref >60.00)
Glucose, Bld: 85 mg/dL (ref 70–99)
Potassium: 4 mEq/L (ref 3.5–5.1)
Sodium: 138 mEq/L (ref 135–145)
Total Bilirubin: 0.4 mg/dL (ref 0.2–1.2)
Total Protein: 7.2 g/dL (ref 6.0–8.3)

## 2019-10-09 LAB — HEPATITIS B SURFACE ANTIGEN: Hepatitis B Surface Ag: NONREACTIVE

## 2019-10-09 LAB — LIPID PANEL
Cholesterol: 201 mg/dL — ABNORMAL HIGH (ref 0–200)
HDL: 76.9 mg/dL (ref >39.00)
LDL Cholesterol: 105 mg/dL — ABNORMAL HIGH (ref 0–99)
NonHDL: 124.44
Total CHOL/HDL Ratio: 3
Triglycerides: 99 mg/dL (ref 0.0–149.0)
VLDL: 19.8 mg/dL (ref 0.0–40.0)

## 2019-10-09 LAB — RPR: RPR Ser Ql: NONREACTIVE

## 2019-10-09 LAB — VITAMIN D 25 HYDROXY (VIT D DEFICIENCY, FRACTURES): VITD: 17.53 ng/mL — ABNORMAL LOW (ref 30.00–100.00)

## 2019-10-09 LAB — HEPATITIS C ANTIBODY
Hepatitis C Ab: NONREACTIVE
SIGNAL TO CUT-OFF: 0.01 (ref <1.00)

## 2019-10-09 LAB — TSH: TSH: 4.14 u[IU]/mL (ref 0.35–4.50)

## 2019-10-09 LAB — HIV ANTIBODY (ROUTINE TESTING W REFLEX): HIV 1&2 Ab, 4th Generation: NONREACTIVE

## 2019-10-11 ENCOUNTER — Other Ambulatory Visit: Payer: Self-pay | Admitting: Family Medicine

## 2019-10-11 DIAGNOSIS — N390 Urinary tract infection, site not specified: Secondary | ICD-10-CM

## 2019-10-11 LAB — URINE CULTURE
MICRO NUMBER:: 10081638
SPECIMEN QUALITY:: ADEQUATE

## 2019-10-11 MED ORDER — NITROFURANTOIN MONOHYD MACRO 100 MG PO CAPS: 100.0000 mg | ORAL_CAPSULE | Freq: Two times a day (BID) | ORAL | 0 refills | Status: DC

## 2019-10-11 NOTE — Progress Notes (Signed)
macro

## 2019-10-14 ENCOUNTER — Telehealth: Payer: Self-pay

## 2019-10-14 LAB — URINE CYTOLOGY ANCILLARY ONLY
Bacterial Vaginitis-Urine: POSITIVE — AB
Bacterial Vaginitis-Urine: POSITIVE — AB
Bacterial Vaginitis-Urine: POSITIVE — AB
Bacterial Vaginitis-Urine: POSITIVE — AB
Candida Urine: POSITIVE — AB
Chlamydia: NEGATIVE
Comment: NEGATIVE
Comment: NEGATIVE
Comment: NORMAL
Neisseria Gonorrhea: NEGATIVE
Trichomonas: NEGATIVE

## 2019-10-14 NOTE — Telephone Encounter (Signed)
Patient needs Dr.  Laury Axon to call in a prescription for Erin [name])  In to her pharmacy please follow up with the patient at [phone] thanks.

## 2019-10-15 MED ORDER — METRONIDAZOLE 500 MG PO TABS: 500.0000 mg | ORAL_TABLET | Freq: Two times a day (BID) | ORAL | 0 refills | Status: DC

## 2019-10-15 NOTE — Addendum Note (Signed)
Addended by: Roxanne Gates on: 10/15/2019 08:28 AM   Modules accepted: Orders

## 2019-10-15 NOTE — Telephone Encounter (Signed)
Patient urine cytology also showed bv pt needs flagyl per labs- see labs.    Can she get diflucan also?

## 2019-10-15 NOTE — Telephone Encounter (Signed)
Yes that is fine

## 2019-10-16 MED ORDER — FLUCONAZOLE 150 MG PO TABS: 150.0000 mg | ORAL_TABLET | Freq: Once | ORAL | 0 refills | Status: AC

## 2019-10-16 NOTE — Telephone Encounter (Signed)
Med sent.

## 2019-10-28 ENCOUNTER — Ambulatory Visit (HOSPITAL_BASED_OUTPATIENT_CLINIC_OR_DEPARTMENT_OTHER)
Admission: RE | Admit: 2019-10-28 | Discharge: 2019-10-28 | Disposition: A | Discharge status: Home / Self Care | Payer: BC Managed Care – PPO | Source: Ambulatory Visit | Attending: Family Medicine | Admitting: Family Medicine

## 2019-10-28 ENCOUNTER — Other Ambulatory Visit: Payer: Self-pay

## 2019-10-28 ENCOUNTER — Encounter (HOSPITAL_BASED_OUTPATIENT_CLINIC_OR_DEPARTMENT_OTHER): Payer: Self-pay

## 2019-10-28 DIAGNOSIS — Z1231 Encounter for screening mammogram for malignant neoplasm of breast: Secondary | ICD-10-CM | POA: Insufficient documentation

## 2019-10-29 ENCOUNTER — Other Ambulatory Visit: Payer: Self-pay | Admitting: Family Medicine

## 2019-10-29 DIAGNOSIS — R928 Other abnormal and inconclusive findings on diagnostic imaging of breast: Secondary | ICD-10-CM

## 2019-11-06 ENCOUNTER — Encounter: Payer: BC Managed Care – PPO | Admitting: Obstetrics & Gynecology

## 2019-11-11 ENCOUNTER — Ambulatory Visit: Payer: BC Managed Care – PPO

## 2019-11-12 ENCOUNTER — Other Ambulatory Visit: Payer: Self-pay

## 2019-11-12 ENCOUNTER — Ambulatory Visit
Admission: RE | Admit: 2019-11-12 | Discharge: 2019-11-12 | Disposition: A | Discharge status: Home / Self Care | Payer: BC Managed Care – PPO | Source: Ambulatory Visit | Attending: Family Medicine | Admitting: Family Medicine

## 2019-11-12 ENCOUNTER — Other Ambulatory Visit: Payer: Self-pay | Admitting: Family Medicine

## 2019-11-12 DIAGNOSIS — R928 Other abnormal and inconclusive findings on diagnostic imaging of breast: Secondary | ICD-10-CM

## 2019-12-05 ENCOUNTER — Encounter: Payer: BC Managed Care – PPO | Admitting: Family Medicine

## 2019-12-05 DIAGNOSIS — Z01419 Encounter for gynecological examination (general) (routine) without abnormal findings: Secondary | ICD-10-CM

## 2020-01-02 ENCOUNTER — Other Ambulatory Visit: Payer: Self-pay | Admitting: Nurse Practitioner

## 2020-01-02 DIAGNOSIS — J0140 Acute pansinusitis, unspecified: Secondary | ICD-10-CM

## 2020-01-02 DIAGNOSIS — J209 Acute bronchitis, unspecified: Secondary | ICD-10-CM

## 2020-01-07 ENCOUNTER — Other Ambulatory Visit: Payer: Self-pay | Admitting: Nurse Practitioner

## 2020-01-07 DIAGNOSIS — J209 Acute bronchitis, unspecified: Secondary | ICD-10-CM

## 2020-01-07 DIAGNOSIS — J0140 Acute pansinusitis, unspecified: Secondary | ICD-10-CM

## 2020-03-20 ENCOUNTER — Other Ambulatory Visit: Payer: Self-pay | Admitting: Family Medicine

## 2020-03-20 DIAGNOSIS — E559 Vitamin D deficiency, unspecified: Secondary | ICD-10-CM

## 2020-06-10 ENCOUNTER — Other Ambulatory Visit: Payer: BC Managed Care – PPO

## 2020-06-10 DIAGNOSIS — Z20822 Contact with and (suspected) exposure to covid-19: Secondary | ICD-10-CM

## 2020-06-11 LAB — SARS-COV-2, NAA 2 DAY TAT

## 2020-06-11 LAB — NOVEL CORONAVIRUS, NAA: SARS-CoV-2, NAA: NOT DETECTED

## 2020-06-12 ENCOUNTER — Other Ambulatory Visit: Payer: Self-pay | Admitting: Family Medicine

## 2020-09-01 ENCOUNTER — Encounter: Payer: Self-pay | Admitting: Family Medicine

## 2020-09-01 ENCOUNTER — Telehealth: Payer: BC Managed Care – PPO | Admitting: Family Medicine

## 2020-09-01 ENCOUNTER — Ambulatory Visit: Payer: BC Managed Care – PPO | Admitting: Family Medicine

## 2020-09-01 ENCOUNTER — Other Ambulatory Visit: Payer: Self-pay

## 2020-09-02 ENCOUNTER — Telehealth (INDEPENDENT_AMBULATORY_CARE_PROVIDER_SITE_OTHER): Payer: BC Managed Care – PPO | Admitting: Family Medicine

## 2020-09-02 ENCOUNTER — Encounter: Payer: Self-pay | Admitting: Family Medicine

## 2020-09-02 DIAGNOSIS — E559 Vitamin D deficiency, unspecified: Secondary | ICD-10-CM | POA: Diagnosis not present

## 2020-09-02 DIAGNOSIS — J0140 Acute pansinusitis, unspecified: Secondary | ICD-10-CM

## 2020-09-02 DIAGNOSIS — E039 Hypothyroidism, unspecified: Secondary | ICD-10-CM

## 2020-09-02 DIAGNOSIS — N390 Urinary tract infection, site not specified: Secondary | ICD-10-CM | POA: Diagnosis not present

## 2020-09-02 MED ORDER — FLUTICASONE PROPIONATE 50 MCG/ACT NA SUSP: 2.0000 | Freq: Every day | NASAL | 6 refills | Status: DC

## 2020-09-02 MED ORDER — VITAMIN D (ERGOCALCIFEROL) 1.25 MG (50000 UNIT) PO CAPS: ORAL_CAPSULE | ORAL | 1 refills | Status: DC

## 2020-09-02 MED ORDER — FLUCONAZOLE 150 MG PO TABS: ORAL_TABLET | ORAL | 0 refills | Status: DC

## 2020-09-02 MED ORDER — NITROFURANTOIN MONOHYD MACRO 100 MG PO CAPS: 100.0000 mg | ORAL_CAPSULE | Freq: Two times a day (BID) | ORAL | 0 refills | Status: DC

## 2020-09-02 MED ORDER — LEVOTHYROXINE SODIUM 150 MCG PO TABS: 150.0000 ug | ORAL_TABLET | Freq: Every day | ORAL | 1 refills | Status: DC

## 2020-09-02 NOTE — Progress Notes (Signed)
Virtual Visit via Video Note  I connected with Erin [name] on 09/02/20 at  9:00 AM EST by a video enabled telemedicine application and verified that I am speaking with the correct person using two identifiers.  Location/ participants Patient: in car alone  Provider: home    I discussed the limitations of evaluation and management by telemedicine and the availability of in person appointments. The patient expressed understanding and agreed to proceed.  History of Present Illness: Pt is in her car c/o vaginal moisture -- no d/c or odor.   The last time this happened she has bv and uti.  She was unable to come into office due to allergy symptoms    Observations/Objective: There were no vitals filed for this visit. No fever  Pt is in nad   Assessment and Plan: 1. Acute non-recurrent pansinusitis No sinus symptoms today- just runny nose and allergy symptoms  - fluticasone (FLONASE) 50 MCG/ACT nasal spray; Place 2 sprays into both nostrils daily.  Dispense: 16 g; Refill: 6  2. Vitamin D deficiency Needs refill  - Vitamin D, Ergocalciferol, (DRISDOL) 1.25 MG (50000 UNIT) CAPS capsule; TAKE 1 CAPSULE BY MOUTH ONE TIME PER WEEK  Dispense: 12 capsule; Refill: 1  3. Hypothyroidism, unspecified type Refill me - levothyroxine (SYNTHROID) 150 MCG tablet; Take 1 tablet (150 mcg total) by mouth daily before breakfast.  Dispense: 90 tablet; Refill: 1  4. Urinary tract infection without hematuria, site unspecified Will empirically tx and after covid test can bring pt in for urine / culture and self swab  - nitrofurantoin, macrocrystal-monohydrate, (MACROBID) 100 MG capsule; Take 1 capsule (100 mg total) by mouth 2 (two) times daily.  Dispense: 14 capsule; Refill: 0 - fluconazole (DIFLUCAN) 150 MG tablet; 1 po x1, may repeat in 3 days prn  Dispense: 2 tablet; Refill: 0   Follow Up Instructions:    I discussed the assessment and treatment plan with the patient. The patient was provided an  opportunity to ask questions and all were answered. The patient agreed with the plan and demonstrated an understanding of the instructions.   The patient was advised to call back or seek an in-person evaluation if the symptoms worsen or if the condition fails to improve as anticipated.  I provided 25 minutes of non-face-to-face time during this encounter.   Donato Schultz, DO

## 2020-09-12 ENCOUNTER — Encounter: Payer: Self-pay | Admitting: Family Medicine

## 2020-12-17 ENCOUNTER — Other Ambulatory Visit: Payer: Self-pay

## 2020-12-17 ENCOUNTER — Ambulatory Visit (INDEPENDENT_AMBULATORY_CARE_PROVIDER_SITE_OTHER): Payer: BC Managed Care – PPO | Admitting: Family Medicine

## 2020-12-17 ENCOUNTER — Encounter: Payer: Self-pay | Admitting: Family Medicine

## 2020-12-17 ENCOUNTER — Other Ambulatory Visit (HOSPITAL_COMMUNITY)
Admission: RE | Admit: 2020-12-17 | Discharge: 2020-12-17 | Disposition: A | Discharge status: Home / Self Care | Payer: BC Managed Care – PPO | Source: Ambulatory Visit | Attending: Family Medicine | Admitting: Family Medicine

## 2020-12-17 VITALS — BP 120/90 | HR 86 | Temp 98.9°F | Resp 18 | Ht 69.0 in | Wt 235.0 lb

## 2020-12-17 DIAGNOSIS — Z113 Encounter for screening for infections with a predominantly sexual mode of transmission: Secondary | ICD-10-CM | POA: Diagnosis not present

## 2020-12-17 DIAGNOSIS — L700 Acne vulgaris: Secondary | ICD-10-CM | POA: Diagnosis not present

## 2020-12-17 DIAGNOSIS — J302 Other seasonal allergic rhinitis: Secondary | ICD-10-CM

## 2020-12-17 DIAGNOSIS — Z0000 Encounter for general adult medical examination without abnormal findings: Secondary | ICD-10-CM

## 2020-12-17 DIAGNOSIS — Z0001 Encounter for general adult medical examination with abnormal findings: Secondary | ICD-10-CM | POA: Diagnosis not present

## 2020-12-17 DIAGNOSIS — Z01419 Encounter for gynecological examination (general) (routine) without abnormal findings: Secondary | ICD-10-CM | POA: Diagnosis present

## 2020-12-17 MED ORDER — CLINDAMYCIN PHOSPHATE 1 % EX LOTN: TOPICAL_LOTION | Freq: Two times a day (BID) | CUTANEOUS | 0 refills | Status: DC

## 2020-12-17 MED ORDER — AZELASTINE HCL 0.1 % NA SOLN: 1.0000 | Freq: Two times a day (BID) | NASAL | 12 refills | Status: DC

## 2020-12-17 NOTE — Patient Instructions (Signed)
Preventive Care 84-42 Years Old, Female Preventive care refers to lifestyle choices and visits with your health care provider that can promote health and wellness. This includes:  A yearly physical exam. This is also called an annual wellness visit.  Regular dental and eye exams.  Immunizations.  Screening for certain conditions.  Healthy lifestyle choices, such as: ? Eating a healthy diet. ? Getting regular exercise. ? Not using drugs or products that contain nicotine and tobacco. ? Limiting alcohol use. What can I expect for my preventive care visit? Physical exam Your health care provider will check your:  Height and weight. These may be used to calculate your BMI (body mass index). BMI is a measurement that tells if you are at a healthy weight.  Heart rate and blood pressure.  Body temperature.  Skin for abnormal spots. Counseling Your health care provider may ask you questions about your:  Past medical problems.  Family's medical history.  Alcohol, tobacco, and drug use.  Emotional well-being.  Home life and relationship well-being.  Sexual activity.  Diet, exercise, and sleep habits.  Work and work Statistician.  Access to firearms.  Method of birth control.  Menstrual cycle.  Pregnancy history. What immunizations do I need? Vaccines are usually given at various ages, according to a schedule. Your health care provider will recommend vaccines for you based on your age, medical history, and lifestyle or other factors, such as travel or where you work.   What tests do I need? Blood tests  Lipid and cholesterol levels. These may be checked every 5 years, or more often if you are over 3 years old.  Hepatitis C test.  Hepatitis B test. Screening  Lung cancer screening. You may have this screening every year starting at age 73 if you have a 30-pack-year history of smoking and currently smoke or have quit within the past 15 years.  Colorectal cancer  screening. ? All adults should have this screening starting at age 52 and continuing until age 17. ? Your health care provider may recommend screening at age 49 if you are at increased risk. ? You will have tests every 1-10 years, depending on your results and the type of screening test.  Diabetes screening. ? This is done by checking your blood sugar (glucose) after you have not eaten for a while (fasting). ? You may have this done every 1-3 years.  Mammogram. ? This may be done every 1-2 years. ? Talk with your health care provider about when you should start having regular mammograms. This may depend on whether you have a family history of breast cancer.  BRCA-related cancer screening. This may be done if you have a family history of breast, ovarian, tubal, or peritoneal cancers.  Pelvic exam and Pap test. ? This may be done every 3 years starting at age 10. ? Starting at age 11, this may be done every 5 years if you have a Pap test in combination with an HPV test. Other tests  STD (sexually transmitted disease) testing, if you are at risk.  Bone density scan. This is done to screen for osteoporosis. You may have this scan if you are at high risk for osteoporosis. Talk with your health care provider about your test results, treatment options, and if necessary, the need for more tests. Follow these instructions at home: Eating and drinking  Eat a diet that includes fresh fruits and vegetables, whole grains, lean protein, and low-fat dairy products.  Take vitamin and mineral supplements  as recommended by your health care provider.  Do not drink alcohol if: ? Your health care provider tells you not to drink. ? You are pregnant, may be pregnant, or are planning to become pregnant.  If you drink alcohol: ? Limit how much you have to 0-1 drink a day. ? Be aware of how much alcohol is in your drink. In the [place] one drink equals one 12 oz bottle of beer (355 mL), one 5 oz glass of  wine (148 mL), or one 1 oz glass of hard liquor (44 mL).   Lifestyle  Take daily care of your teeth and gums. Brush your teeth every morning and night with fluoride toothpaste. Floss one time each day.  Stay active. Exercise for at least 30 minutes 5 or more days each week.  Do not use any products that contain nicotine or tobacco, such as cigarettes, e-cigarettes, and chewing tobacco. If you need help quitting, ask your health care provider.  Do not use drugs.  If you are sexually active, practice safe sex. Use a condom or other form of protection to prevent STIs (sexually transmitted infections).  If you do not wish to become pregnant, use a form of birth control. If you plan to become pregnant, see your health care provider for a prepregnancy visit.  If told by your health care provider, take low-dose aspirin daily starting at age 50.  Find healthy ways to cope with stress, such as: ? Meditation, yoga, or listening to music. ? Journaling. ? Talking to a trusted person. ? Spending time with friends and family. Safety  Always wear your seat belt while driving or riding in a vehicle.  Do not drive: ? If you have been drinking alcohol. Do not ride with someone who has been drinking. ? When you are tired or distracted. ? While texting.  Wear a helmet and other protective equipment during sports activities.  If you have firearms in your house, make sure you follow all gun safety procedures. What's next?  Visit your health care provider once a year for an annual wellness visit.  Ask your health care provider how often you should have your eyes and teeth checked.  Stay up to date on all vaccines. This information is not intended to replace advice given to you by your health care provider. Make sure you discuss any questions you have with your health care provider. Document Revised: 06/02/2020 Document Reviewed: 05/10/2018 Elsevier Patient Education  2021 [employer]vier Inc.  

## 2020-12-17 NOTE — Progress Notes (Signed)
Patient ID: Erin [name], female    DOB: Jan 19, 1981  Age: 40 y.o. MRN: [id number]    Subjective:  Subjective  HPI Erin [name] presents for presents for comprehensive physical exam today and follow up on management of chronic concerns. She reports that she is not feeling her best and could have been better. She states that she is trying to take control over her weight and reports stress due to spring break ending soon. She denies any chest pain, SOB, fever, abdominal pain, cough, chills, sore throat, dysuria, urinary incontinence, back pain, HA, or N/VD. She states feeling dryness in her face around her sinuses. She states that when she wakes up in the morning she feels fatigued. She expresses interest in getting an allergy medication that does not cause downiness.   Review of Systems  Constitutional: Negative for chills, fatigue and fever.  HENT: Positive for congestion. Negative for rhinorrhea, sinus pressure, sinus pain and sore throat.   Eyes: Negative for pain.  Respiratory: Negative for cough and shortness of breath.   Cardiovascular: Negative for chest pain, palpitations and leg swelling.  Gastrointestinal: Negative for abdominal pain, blood in stool, diarrhea, nausea and vomiting.  Genitourinary: Negative for decreased urine volume, flank pain, frequency, vaginal bleeding and vaginal discharge.  Musculoskeletal: Negative for back pain.  Neurological: Negative for headaches.    History Past Medical History:  Diagnosis Date  . Abortion history    x3  . Asthma   . Elevated blood pressure   . Hypertension     She has a past surgical history that includes Induced abortion; Breast surgery; Thyroidectomy (10/30/08); and Reduction mammaplasty.   Her family history includes Coronary artery disease in her father; Diabetes in her maternal aunt; Hypertension in her mother; Lung cancer in an other family member; Stroke in her father.She reports that she has never smoked. She has never  used smokeless tobacco. She reports current alcohol use of about 3.0 standard drinks of alcohol per week. She reports that she does not use drugs.  Current Outpatient Medications on File Prior to Visit  Medication Sig Dispense Refill  . fluticasone (FLONASE) 50 MCG/ACT nasal spray Place 2 sprays into both nostrils daily. 16 g 6  . fluticasone (FLONASE) 50 MCG/ACT nasal spray Place 2 sprays into both nostrils daily. 16 g 6  . levocetirizine (XYZAL) 5 MG tablet Take 1 tablet (5 mg total) by mouth every evening. 30 tablet 5  . levothyroxine (SYNTHROID) 150 MCG tablet Take 1 tablet (150 mcg total) by mouth daily before breakfast. 90 tablet 1  . Multiple Vitamin (MULTIVITAMIN) tablet Take 1 tablet by mouth daily.    . Norethindrone-Ethinyl Estradiol-Fe Biphas (LO LOESTRIN FE) 1 MG-10 MCG / 10 MCG tablet Take 1 tablet by mouth daily. 1 Package 11  . Phentermine HCl 8 MG TABS Take 0.5 tablets by mouth daily.    . sertraline (ZOLOFT) 25 MG tablet Take 1 tablet by mouth daily.    Marland Kitchen topiramate (TOPAMAX) 25 MG tablet Take by mouth.    . Vitamin D, Ergocalciferol, (DRISDOL) 1.25 MG (50000 UNIT) CAPS capsule TAKE 1 CAPSULE BY MOUTH ONE TIME PER WEEK 12 capsule 1   No current facility-administered medications on file prior to visit.     Objective:  Objective  Physical Exam Constitutional:      General: She is not in acute distress.    Appearance: Normal appearance. She is not ill-appearing or toxic-appearing.  HENT:     Head: Normocephalic and atraumatic.  Right Ear: Tympanic membrane, ear canal and external ear normal.     Left Ear: Tympanic membrane, ear canal and external ear normal.     Nose: No congestion or rhinorrhea.  Eyes:     Extraocular Movements: Extraocular movements intact.     Pupils: Pupils are equal, round, and reactive to light.  Cardiovascular:     Rate and Rhythm: Normal rate and regular rhythm.     Pulses: Normal pulses.     Heart sounds: Normal heart sounds. No murmur  heard.   Pulmonary:     Effort: Pulmonary effort is normal. No respiratory distress.     Breath sounds: Normal breath sounds. No wheezing, rhonchi or rales.  Abdominal:     General: Bowel sounds are normal.     Palpations: Abdomen is soft. There is no mass.     Tenderness: There is no abdominal tenderness. There is no guarding.     Hernia: No hernia is present.  Genitourinary:    Rectum: Normal. Guaiac result negative.  Musculoskeletal:        General: Normal range of motion.     Cervical back: Normal range of motion and neck supple.  Skin:    General: Skin is warm and dry.     Comments: (+) acne in facial area  Neurological:     Mental Status: She is alert and oriented to person, place, and time.  Psychiatric:        Behavior: Behavior normal.    BP 120/90 (BP Location: Right Arm, Patient Position: Sitting, Cuff Size: Large)   Pulse 86   Temp 98.9 F (37.2 C) (Oral)   Resp 18   Ht 5\' 9"  (1.753 m)   Wt 235 lb (106.6 kg)   SpO2 96%   BMI 34.70 kg/m  Wt Readings from Last 3 Encounters:  12/17/20 235 lb (106.6 kg)  10/08/19 219 lb 6.4 oz (99.5 kg)  09/24/19 219 lb (99.3 kg)     Lab Results  Component Value Date   WBC 7.3 12/17/2020   HGB 13.3 12/17/2020   HCT 39.8 12/17/2020   PLT 226.0 12/17/2020   GLUCOSE 73 12/17/2020   CHOL 184 12/17/2020   TRIG 99.0 12/17/2020   HDL 63.30 12/17/2020   LDLCALC 101 (H) 12/17/2020   ALT 10 12/17/2020   AST 16 12/17/2020   NA 137 12/17/2020   K 4.7 12/17/2020   CL 105 12/17/2020   CREATININE 1.00 12/17/2020   BUN 17 12/17/2020   CO2 26 12/17/2020   TSH 0.27 (L) 12/17/2020   HGBA1C 5.9 12/17/2020    US BREAST LTD UNI LEFT INC AXILLA  Result Date: 11/12/2019 CLINICAL DATA:  40 year old female for further evaluation of possible LEFT breast asymmetry on baseline screening mammogram. History of bilateral breast reductions. EXAM: DIGITAL DIAGNOSTIC LEFT MAMMOGRAM WITH CAD AND TOMO ULTRASOUND LEFT BREAST COMPARISON:   10/28/2019 mammogram ACR Breast Density Category b: There are scattered areas of fibroglandular density. FINDINGS: 2D/3D full field and spot compression views of the LEFT breast demonstrate minimal asymmetry in the RETROAREOLAR region. No discrete mass or worrisome calcifications are identified. Mammographic images were processed with CAD. On physical exam, periareolar reduction scars. Targeted ultrasound is performed, showing normal fibroglandular tissue in the RETROAREOLAR region without solid or cystic mass, nonsurgical distortion or abnormal shadowing. IMPRESSION: 1. No persistent suspicious mammographic or sonographic abnormality in the LEFT breast, at the site of the screening study finding. The mammographic appearance is compatible with prior reduction changes. RECOMMENDATION: Bilateral  screening mammogram in 1 year. I have discussed the findings and recommendations with the patient. If applicable, a reminder letter will be sent to the patient regarding the next appointment. BI-RADS CATEGORY  2: Benign. Electronically Signed   By: Harmon Pier M.D.   On: 11/12/2019 16:09   MM DIAG BREAST TOMO UNI LEFT  Result Date: 11/12/2019 CLINICAL DATA:  40 year old female for further evaluation of possible LEFT breast asymmetry on baseline screening mammogram. History of bilateral breast reductions. EXAM: DIGITAL DIAGNOSTIC LEFT MAMMOGRAM WITH CAD AND TOMO ULTRASOUND LEFT BREAST COMPARISON:  10/28/2019 mammogram ACR Breast Density Category b: There are scattered areas of fibroglandular density. FINDINGS: 2D/3D full field and spot compression views of the LEFT breast demonstrate minimal asymmetry in the RETROAREOLAR region. No discrete mass or worrisome calcifications are identified. Mammographic images were processed with CAD. On physical exam, periareolar reduction scars. Targeted ultrasound is performed, showing normal fibroglandular tissue in the RETROAREOLAR region without solid or cystic mass, nonsurgical  distortion or abnormal shadowing. IMPRESSION: 1. No persistent suspicious mammographic or sonographic abnormality in the LEFT breast, at the site of the screening study finding. The mammographic appearance is compatible with prior reduction changes. RECOMMENDATION: Bilateral screening mammogram in 1 year. I have discussed the findings and recommendations with the patient. If applicable, a reminder letter will be sent to the patient regarding the next appointment. BI-RADS CATEGORY  2: Benign. Electronically Signed   By: Harmon Pier M.D.   On: 11/12/2019 16:09      Health Maintenance  Topic Date Due  . INFLUENZA VACCINE  04/12/2021  . PAP SMEAR-Modifier  07/26/2021  . TETANUS/TDAP  07/26/2028  . COVID-19 Vaccine  Completed  . Hepatitis C Screening  Completed  . HIV Screening  Completed  . HPV VACCINES  Aged Out    Assessment & Plan:  Plan    Meds ordered this encounter  Medications  . azelastine (ASTELIN) 0.1 % nasal spray    Sig: Place 1 spray into both nostrils 2 (two) times daily. Use in each nostril as directed    Dispense:  30 mL    Refill:  12  . clindamycin (CLEOCIN-T) 1 % lotion    Sig: Apply topically 2 (two) times daily.    Dispense:  60 mL    Refill:  0    Problem List Items Addressed This Visit      Unprioritized   Acne vulgaris   Relevant Medications   clindamycin (CLEOCIN-T) 1 % lotion   Morbid obesity (HCC)    Pt seeing bariatric clinic at [facility]       Relevant Medications   Phentermine HCl 8 MG TABS   Other Relevant Orders   Lipid panel (Completed)   CBC with Differential/Platelet (Completed)   Comprehensive metabolic panel (Completed)   TSH (Completed)   Hemoglobin A1c (Completed)   Preventative health care - Primary    ghm utd Check labs  See avs       Relevant Orders   Lipid panel (Completed)   CBC with Differential/Platelet (Completed)   Comprehensive metabolic panel (Completed)   TSH (Completed)   Hemoglobin A1c (Completed)   Cytology -  PAP( [facility])   Seasonal allergies   Relevant Medications   azelastine (ASTELIN) 0.1 % nasal spray     Mammo: Last completed on 11/12/2019, no persistent suspicious abnormality in left breast, repeat in 1 year.  Pap smear: Last completed today, results were normal, repeat every 3 years.  Follow-up: Return in about 1  year (around 12/17/2021), or if symptoms worsen or fail to improve, for annual exam, fasting, diabetes II.   I,David Hanna,acting as a Neurosurgeon for [employer], DO.,have documented all relevant documentation on the behalf of Donato Schultz, DO,as directed by  Donato Schultz, DO while in the presence of Donato Schultz, DO.  I, Donato Schultz, DO, have reviewed all documentation for this visit. The documentation on 12/20/20 for the exam, diagnosis, procedures, and orders are all accurate and complete.

## 2020-12-18 LAB — COMPREHENSIVE METABOLIC PANEL
ALT: 10 U/L (ref 0–35)
AST: 16 U/L (ref 0–37)
Albumin: 4.3 g/dL (ref 3.5–5.2)
Alkaline Phosphatase: 63 U/L (ref 39–117)
BUN: 17 mg/dL (ref 6–23)
CO2: 26 mEq/L (ref 19–32)
Calcium: 9.2 mg/dL (ref 8.4–10.5)
Chloride: 105 mEq/L (ref 96–112)
Creatinine, Ser: 1 mg/dL (ref 0.40–1.20)
GFR: 69.98 mL/min (ref >60.00)
Glucose, Bld: 73 mg/dL (ref 70–99)
Potassium: 4.7 mEq/L (ref 3.5–5.1)
Sodium: 137 mEq/L (ref 135–145)
Total Bilirubin: 0.3 mg/dL (ref 0.2–1.2)
Total Protein: 7 g/dL (ref 6.0–8.3)

## 2020-12-18 LAB — CBC WITH DIFFERENTIAL/PLATELET
Basophils Absolute: 0.1 10*3/uL (ref 0.0–0.1)
Basophils Relative: 1 % (ref 0.0–3.0)
Eosinophils Absolute: 0.3 10*3/uL (ref 0.0–0.7)
Eosinophils Relative: 4 % (ref 0.0–5.0)
HCT: 39.8 % (ref 36.0–46.0)
Hemoglobin: 13.3 g/dL (ref 12.0–15.0)
Lymphocytes Relative: 28.3 % (ref 12.0–46.0)
Lymphs Abs: 2.1 10*3/uL (ref 0.7–4.0)
MCHC: 33.4 g/dL (ref 30.0–36.0)
MCV: 85.5 fl (ref 78.0–100.0)
Monocytes Absolute: 0.7 10*3/uL (ref 0.1–1.0)
Monocytes Relative: 9.3 % (ref 3.0–12.0)
Neutro Abs: 4.2 10*3/uL (ref 1.4–7.7)
Neutrophils Relative %: 57.4 % (ref 43.0–77.0)
Platelets: 226 10*3/uL (ref 150.0–400.0)
RBC: 4.66 Mil/uL (ref 3.87–5.11)
RDW: 15.9 % — ABNORMAL HIGH (ref 11.5–15.5)
WBC: 7.3 10*3/uL (ref 4.0–10.5)

## 2020-12-18 LAB — LIPID PANEL
Cholesterol: 184 mg/dL (ref 0–200)
HDL: 63.3 mg/dL (ref >39.00)
LDL Cholesterol: 101 mg/dL — ABNORMAL HIGH (ref 0–99)
NonHDL: 120.61
Total CHOL/HDL Ratio: 3
Triglycerides: 99 mg/dL (ref 0.0–149.0)
VLDL: 19.8 mg/dL (ref 0.0–40.0)

## 2020-12-18 LAB — TSH: TSH: 0.27 u[IU]/mL — ABNORMAL LOW (ref 0.35–4.50)

## 2020-12-18 LAB — HEMOGLOBIN A1C: Hgb A1c MFr Bld: 5.9 % (ref 4.6–6.5)

## 2020-12-20 ENCOUNTER — Other Ambulatory Visit: Payer: Self-pay | Admitting: Family Medicine

## 2020-12-20 DIAGNOSIS — E039 Hypothyroidism, unspecified: Secondary | ICD-10-CM

## 2020-12-20 DIAGNOSIS — Z0000 Encounter for general adult medical examination without abnormal findings: Secondary | ICD-10-CM | POA: Insufficient documentation

## 2020-12-20 DIAGNOSIS — L700 Acne vulgaris: Secondary | ICD-10-CM | POA: Insufficient documentation

## 2020-12-20 DIAGNOSIS — J302 Other seasonal allergic rhinitis: Secondary | ICD-10-CM | POA: Insufficient documentation

## 2020-12-20 NOTE — Assessment & Plan Note (Signed)
Pt seeing bariatric clinic at [facility]

## 2020-12-20 NOTE — Assessment & Plan Note (Signed)
ghm utd Check labs  See avs  

## 2020-12-21 ENCOUNTER — Other Ambulatory Visit: Payer: Self-pay

## 2020-12-21 DIAGNOSIS — E039 Hypothyroidism, unspecified: Secondary | ICD-10-CM

## 2020-12-21 MED ORDER — LEVOTHYROXINE SODIUM 137 MCG PO TABS: 137.0000 ug | ORAL_TABLET | Freq: Every day | ORAL | 2 refills | Status: DC

## 2020-12-22 LAB — CYTOLOGY - PAP
Chlamydia: NEGATIVE
Comment: NEGATIVE
Comment: NEGATIVE
Comment: NEGATIVE
Comment: NORMAL
Diagnosis: NEGATIVE
HSV1: NEGATIVE
HSV2: NEGATIVE
Neisseria Gonorrhea: NEGATIVE
Trichomonas: NEGATIVE

## 2021-01-01 ENCOUNTER — Encounter: Payer: Self-pay | Admitting: Family Medicine

## 2021-02-16 ENCOUNTER — Other Ambulatory Visit: Payer: Self-pay | Admitting: Family Medicine

## 2021-02-16 DIAGNOSIS — E559 Vitamin D deficiency, unspecified: Secondary | ICD-10-CM

## 2021-02-16 NOTE — Telephone Encounter (Signed)
Would you like Pt to continue Ergocalciferol?  

## 2021-02-16 NOTE — Telephone Encounter (Signed)
Normally pt are able to transition to otc vita d3 1000u daily

## 2021-05-11 ENCOUNTER — Other Ambulatory Visit: Payer: Self-pay

## 2021-05-11 ENCOUNTER — Encounter: Payer: Self-pay | Admitting: Family Medicine

## 2021-05-11 ENCOUNTER — Ambulatory Visit: Payer: BC Managed Care – PPO | Admitting: Family Medicine

## 2021-05-11 VITALS — BP 126/86 | HR 96 | Temp 98.6°F | Ht 69.0 in | Wt 242.0 lb

## 2021-05-11 DIAGNOSIS — L03115 Cellulitis of right lower limb: Secondary | ICD-10-CM

## 2021-05-11 MED ORDER — DOXYCYCLINE HYCLATE 100 MG PO TABS: 100.0000 mg | ORAL_TABLET | Freq: Two times a day (BID) | ORAL | 0 refills | Status: AC

## 2021-05-11 MED ORDER — FLUCONAZOLE 150 MG PO TABS: ORAL_TABLET | ORAL | 0 refills | Status: DC

## 2021-05-11 MED ORDER — TRIAMCINOLONE ACETONIDE 0.1 % EX CREA: 1.0000 "application " | TOPICAL_CREAM | Freq: Two times a day (BID) | CUTANEOUS | 0 refills | Status: DC

## 2021-05-11 NOTE — Progress Notes (Signed)
Chief Complaint  Patient presents with   Insect Bite    Right foot     Erin [name] is a 40 y.o. female here for a skin complaint.  Duration: 1 day Location: Top of R foot Pruritic? Yes Painful? Yes Drainage? No New soaps/lotions/topicals/detergents? No Sick contacts? No Other associated symptoms: denies fevers, has swelling and redness that appears Therapies tried thus far: Tylenol  Past Medical History:  Diagnosis Date   Abortion history    x3   Asthma    Elevated blood pressure    Hypertension     BP 126/86   Pulse 96   Temp 98.6 F (37 C) (Oral)   Ht 5\' 9"  (1.753 m)   Wt 242 lb (109.8 kg)   SpO2 98%   BMI 35.74 kg/m  Gen: awake, alert, appearing stated age Lungs: No accessory muscle use Skin: Soft tissue swelling noted with some warmth compared to surrounding tissues, TTP. No drainage, fluctuance, excoriation Psych: Age appropriate judgment and insight   Right foot  Cellulitis of right lower extremity - Plan: fluconazole (DIFLUCAN) 150 MG tablet, doxycycline (VIBRA-TABS) 100 MG tablet  7 days of doxycycline, Diflucan should she develop a yeast infection from it.  Ice, Tylenol, ibuprofen.  Send me a message if things or not improving, seek more immediate care if worsening. F/u prn. The patient voiced understanding and agreement to the plan.  Ritchie, DO 05/11/21 4:33 PM

## 2021-05-11 NOTE — Patient Instructions (Signed)
Ice/cold pack over area for 10-15 min twice daily.  OK to take Tylenol 1000 mg (2 extra strength tabs) or 975 mg (3 regular strength tabs) every 6 hours as needed.  Ibuprofen 400-600 mg (2-3 over the counter strength tabs) every 6 hours as needed for pain.  Let us know if you need anything. 

## 2021-05-22 ENCOUNTER — Other Ambulatory Visit: Payer: Self-pay | Admitting: Family Medicine

## 2021-05-22 DIAGNOSIS — E039 Hypothyroidism, unspecified: Secondary | ICD-10-CM

## 2021-08-19 ENCOUNTER — Other Ambulatory Visit: Payer: Self-pay | Admitting: Family Medicine

## 2021-08-19 DIAGNOSIS — E039 Hypothyroidism, unspecified: Secondary | ICD-10-CM

## 2021-11-04 ENCOUNTER — Other Ambulatory Visit: Payer: Self-pay | Admitting: Family Medicine

## 2021-11-04 DIAGNOSIS — E039 Hypothyroidism, unspecified: Secondary | ICD-10-CM

## 2021-12-20 ENCOUNTER — Encounter: Payer: BC Managed Care – PPO | Admitting: Family Medicine

## 2021-12-27 ENCOUNTER — Other Ambulatory Visit: Payer: Self-pay | Admitting: Family Medicine

## 2021-12-27 DIAGNOSIS — E039 Hypothyroidism, unspecified: Secondary | ICD-10-CM

## 2022-02-10 ENCOUNTER — Ambulatory Visit: Payer: BC Managed Care – PPO | Admitting: Family Medicine

## 2022-02-10 ENCOUNTER — Encounter: Payer: Self-pay | Admitting: Family Medicine

## 2022-02-10 VITALS — BP 126/88 | HR 77 | Temp 98.2°F | Resp 18 | Ht 69.0 in | Wt 240.2 lb

## 2022-02-10 DIAGNOSIS — J0140 Acute pansinusitis, unspecified: Secondary | ICD-10-CM

## 2022-02-10 DIAGNOSIS — R232 Flushing: Secondary | ICD-10-CM

## 2022-02-10 DIAGNOSIS — R61 Generalized hyperhidrosis: Secondary | ICD-10-CM

## 2022-02-10 DIAGNOSIS — J302 Other seasonal allergic rhinitis: Secondary | ICD-10-CM

## 2022-02-10 MED ORDER — AZELASTINE HCL 0.1 % NA SOLN: 1.0000 | Freq: Two times a day (BID) | NASAL | 12 refills | Status: AC

## 2022-02-10 MED ORDER — DRYSOL 20 % EX SOLN: Freq: Every day | CUTANEOUS | 0 refills | Status: DC

## 2022-02-10 NOTE — Progress Notes (Signed)
Subjective:   By signing my name below, I, [name], attest that this documentation has been prepared under the direction and in the presence of Roma Schanz DO, 02/10/2022   Patient ID: Erin [name], female    DOB: 1981/02/02, 41 y.o.   MRN: [id number]  Chief Complaint  Patient presents with   sweating    HPI Patient is in today for an office visit  Patient complains of hot flashes, night sweats, excessive sweating especially in armpits, and around the neck area. 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 She notes that her mother has a history of severe hot flashes during menopause. She also states that she is interested in cosmetic or dermatology options to address sweating symptoms. She is also interested in a clinical grade deodorant that can resolve her symptoms.   She reports that she is currently menstruating and has regular menstrual cycle with excessive blood clots. She reports that she has stopped using birth control ten years ago. Patient also reports that birth control disrupted her menstrual cycle and made her unable " to get her period together".  She reports feeling severe nausea on 02/05/2022. She states that she noticed everyone on her social media complaining of nausea as well.  She also reports experiencing a panic attack during the time of her PMS on 02/05/2022 and states experiencing emotional distress for an hour.  She missed her last physical due to not feeling well. She is interested in getting a comprehensive physical scheduled for the future.     Past Medical History:  Diagnosis Date   Abortion history    x3   Asthma    Elevated blood pressure    Hypertension     Past Surgical History:  Procedure Laterality Date   BREAST SURGERY     Breast reduction   INDUCED ABORTION     x3   REDUCTION MAMMAPLASTY     THYROIDECTOMY  10/30/08   [facility]    Family History  Problem Relation Age of Onset   Coronary  artery disease Father    Stroke Father    Hypertension Mother    Diabetes Maternal Aunt    Lung cancer Other        great uncle    Social History   Socioeconomic History   Marital status: Single    Spouse name: n/a   Number of children: 0   Years of education: Master's   Highest education level: Not on file  Occupational History   Occupation: Science writer: [employer]  Tobacco Use   Smoking status: Never   Smokeless tobacco: Never  Substance and Sexual Activity   Alcohol use: Yes    Alcohol/week: 3.0 standard drinks    Types: 3 Standard drinks or equivalent per week   Drug use: No   Sexual activity: Yes    Partners: Male    Birth control/protection: Condom  Other Topics Concern   Not on file  Social History Narrative   Working on her doctoral degree.  Lives alone.   Social Determinants of Health   Financial Resource Strain: Not on file  Food Insecurity: Not on file  Transportation Needs: Not on file  Physical Activity: Not on file  Stress: Not on file  Social Connections: Not on file  Intimate Partner Violence: Not on file    Outpatient Medications Prior to Visit  Medication Sig Dispense Refill   clindamycin (CLEOCIN-T) 1 % lotion Apply  topically 2 (two) times daily. 60 mL 0   levocetirizine (XYZAL) 5 MG tablet Take 1 tablet (5 mg total) by mouth every evening. 30 tablet 5   levothyroxine (SYNTHROID) 137 MCG tablet Take 1 tablet (137 mcg total) by mouth daily before breakfast. 30 tablet 0   Multiple Vitamin (MULTIVITAMIN) tablet Take 1 tablet by mouth daily.     Norethindrone-Ethinyl Estradiol-Fe Biphas (LO LOESTRIN FE) 1 MG-10 MCG / 10 MCG tablet Take 1 tablet by mouth daily. 1 Package 11   Phentermine HCl 8 MG TABS Take 0.5 tablets by mouth daily.     sertraline (ZOLOFT) 25 MG tablet Take 1 tablet by mouth daily.     triamcinolone cream (KENALOG) 0.1 % Apply 1 application topically 2 (two) times daily. 30 g 0   Vitamin D, Ergocalciferol,  (DRISDOL) 1.25 MG (50000 UNIT) CAPS capsule TAKE 1 CAPSULE BY MOUTH ONE TIME PER WEEK 12 capsule 1   azelastine (ASTELIN) 0.1 % nasal spray Place 1 spray into both nostrils 2 (two) times daily. Use in each nostril as directed 30 mL 12   fluticasone (FLONASE) 50 MCG/ACT nasal spray Place 2 sprays into both nostrils daily. 16 g 6   topiramate (TOPAMAX) 25 MG tablet Take by mouth.     fluconazole (DIFLUCAN) 150 MG tablet Take 1 tab, repeat in 72 hours if no improvement. (Patient not taking: Reported on 02/10/2022) 2 tablet 0   No facility-administered medications prior to visit.    Allergies  Allergen Reactions   Shellfish Allergy Anaphylaxis   Sulfonamide Derivatives     Review of Systems  Constitutional:  Positive for diaphoresis ([place] and [place]ck). Negative for fever and malaise/fatigue.       (+) Hot Flashes  HENT:  Negative for congestion.   Eyes:  Negative for blurred vision.  Respiratory:  Negative for cough and shortness of breath.   Cardiovascular:  Negative for chest pain, palpitations and leg swelling.  Gastrointestinal:  Negative for vomiting.  Musculoskeletal:  Negative for back pain.  Skin:  Negative for rash.  Neurological:  Negative for loss of consciousness and headaches.      Objective:    Physical Exam Vitals and nursing note reviewed.  Constitutional:      General: She is not in acute distress.    Appearance: Normal appearance. She is not ill-appearing.  HENT:     Head: Normocephalic and atraumatic.     Right Ear: External ear normal.     Left Ear: External ear normal.  Eyes:     Extraocular Movements: Extraocular movements intact.     Pupils: Pupils are equal, round, and reactive to light.  Cardiovascular:     Rate and Rhythm: Normal rate and regular rhythm.     Heart sounds: Normal heart sounds. No murmur heard.   No gallop.  Pulmonary:     Effort: Pulmonary effort is normal. No respiratory distress.     Breath sounds: Normal breath sounds. No wheezing  or rales.  Skin:    General: Skin is warm and dry.  Neurological:     Mental Status: She is alert and oriented to person, place, and time.  Psychiatric:        Judgment: Judgment normal.    BP 126/88 (BP Location: Left Arm, Patient Position: Sitting, Cuff Size: Large)   Pulse 77   Temp 98.2 F (36.8 C) (Oral)   Resp 18   Ht 5\' 9"  (1.753 m)   Wt 240 lb 3.2 oz (109 kg)  LMP 02/07/2022   SpO2 98%   BMI 35.47 kg/m  Wt Readings from Last 3 Encounters:  02/10/22 240 lb 3.2 oz (109 kg)  05/11/21 242 lb (109.8 kg)  12/17/20 235 lb (106.6 kg)    Diabetic Foot Exam - Simple   No data filed    Lab Results  Component Value Date   WBC 7.3 12/17/2020   HGB 13.3 12/17/2020   HCT 39.8 12/17/2020   PLT 226.0 12/17/2020   GLUCOSE 73 12/17/2020   CHOL 184 12/17/2020   TRIG 99.0 12/17/2020   HDL 63.30 12/17/2020   LDLCALC 101 (H) 12/17/2020   ALT 10 12/17/2020   AST 16 12/17/2020   NA 137 12/17/2020   K 4.7 12/17/2020   CL 105 12/17/2020   CREATININE 1.00 12/17/2020   BUN 17 12/17/2020   CO2 26 12/17/2020   TSH 0.27 (L) 12/17/2020   HGBA1C 5.9 12/17/2020    Lab Results  Component Value Date   TSH 0.27 (L) 12/17/2020   Lab Results  Component Value Date   WBC 7.3 12/17/2020   HGB 13.3 12/17/2020   HCT 39.8 12/17/2020   MCV 85.5 12/17/2020   PLT 226.0 12/17/2020   Lab Results  Component Value Date   NA 137 12/17/2020   K 4.7 12/17/2020   CO2 26 12/17/2020   GLUCOSE 73 12/17/2020   BUN 17 12/17/2020   CREATININE 1.00 12/17/2020   BILITOT 0.3 12/17/2020   ALKPHOS 63 12/17/2020   AST 16 12/17/2020   ALT 10 12/17/2020   PROT 7.0 12/17/2020   ALBUMIN 4.3 12/17/2020   CALCIUM 9.2 12/17/2020   GFR 69.98 12/17/2020   Lab Results  Component Value Date   CHOL 184 12/17/2020   Lab Results  Component Value Date   HDL 63.30 12/17/2020   Lab Results  Component Value Date   LDLCALC 101 (H) 12/17/2020   Lab Results  Component Value Date   TRIG 99.0 12/17/2020    Lab Results  Component Value Date   CHOLHDL 3 12/17/2020   Lab Results  Component Value Date   HGBA1C 5.9 12/17/2020       Assessment & Plan:   Problem List Items Addressed This Visit       Unprioritized   Seasonal allergies   Relevant Medications   azelastine (ASTELIN) 0.1 % nasal spray   Other Visit Diagnoses     Hot flashes    -  Primary   Relevant Orders   CBC with Differential/Platelet   Comprehensive metabolic panel   Estradiol   FSH   Lipid panel   TSH   Vitamin B12   VITAMIN D 25 Hydroxy (Vit-D Deficiency, Fractures)   Acute non-recurrent pansinusitis       Relevant Medications   azelastine (ASTELIN) 0.1 % nasal spray   Other Relevant Orders   TSH   Hyperhidrosis       Relevant Medications   aluminum chloride (DRYSOL) 20 % external solution       Meds ordered this encounter  Medications   azelastine (ASTELIN) 0.1 % nasal spray    Sig: Place 1 spray into both nostrils 2 (two) times daily. Use in each nostril as directed    Dispense:  30 mL    Refill:  12   aluminum chloride (DRYSOL) 20 % external solution    Sig: Apply topically at bedtime.    Dispense:  35 mL    Refill:  0    I, Ann Held,  DO, personally preformed the services described in this documentation.  All medical record entries made by the scribe were at my direction and in my presence.  I have reviewed the chart and discharge instructions (if applicable) and agree that the record reflects my personal performance and is accurate and complete. 02/10/2022  I,Amber Collins,acting as a scribe for Ann Held, DO.,have documented all relevant documentation on the behalf of Ann Held, DO,as directed by  Ann Held, DO while in the presence of Ann Held, DO.   Ann Held, DO

## 2022-02-10 NOTE — Assessment & Plan Note (Signed)
drysol to pharmacy D/w pt possibility of derm referral

## 2022-02-10 NOTE — Patient Instructions (Signed)
Menopause Menopause is the normal time of a woman's life when menstrual periods stop completely. It marks the natural end to a woman's ability to become pregnant. It can be defined as the absence of a menstrual period for 12 months without another medical cause. The transition to menopause (perimenopause) most often happens between the ages of 45 and 55, and can last for many years. During perimenopause, hormone levels change in your body, which can cause symptoms and affect your health. Menopause may increase your risk for: Weakened bones (osteoporosis), which causes fractures. Depression. Hardening and narrowing of the arteries (atherosclerosis), which can cause heart attacks and strokes. What are the causes? This condition is usually caused by a natural change in hormone levels that happens as you get older. The condition may also be caused by changes that are not natural, including: Surgery to remove both ovaries (surgical menopause). Side effects from some medicines, such as chemotherapy used to treat cancer (chemical menopause). What increases the risk? This condition is more likely to start at an earlier age if you have certain medical conditions or have undergone treatments, including: A tumor of the pituitary gland in the brain. A disease that affects the ovaries and hormones. Certain cancer treatments, such as chemotherapy or hormone therapy, or radiation therapy on the pelvis. Heavy smoking and excessive alcohol use. Family history of early menopause. This condition is also more likely to develop earlier in women who are very thin. What are the signs or symptoms? Symptoms of this condition include: Hot flashes. Irregular menstrual periods. Night sweats. Changes in feelings about sex. This could be a decrease in sex drive or an increased discomfort around your sexuality. Vaginal dryness and thinning of the vaginal walls. This may cause painful sex. Dryness of the skin and  development of wrinkles. Headaches. Problems sleeping (insomnia). Mood swings or irritability. Memory problems. Weight gain. Hair growth on the face and chest. Bladder infections or problems with urinating. How is this diagnosed? This condition is diagnosed based on your medical history, a physical exam, your age, your menstrual history, and your symptoms. Hormone tests may also be done. How is this treated? In some cases, no treatment is needed. You and your health care provider should make a decision together about whether treatment is necessary. Treatment will be based on your individual condition and preferences. Treatment for this condition focuses on managing symptoms. Treatment may include: Menopausal hormone therapy (MHT). Medicines to treat specific symptoms or complications. Acupuncture. Vitamin or herbal supplements. Before starting treatment, make sure to let your health care provider know if you have a personal or family history of these conditions: Heart disease. Breast cancer. Blood clots. Diabetes. Osteoporosis. Follow these instructions at home: Lifestyle Do not use any products that contain nicotine or tobacco, such as cigarettes, e-cigarettes, and chewing tobacco. If you need help quitting, ask your health care provider. Get at least 30 minutes of physical activity on 5 or more days each week. Avoid alcoholic and caffeinated beverages, as well as spicy foods. This may help prevent hot flashes. Get 7-8 hours of sleep each night. If you have hot flashes, try: Dressing in layers. Avoiding things that may trigger hot flashes, such as spicy food, warm places, or stress. Taking slow, deep breaths when a hot flash starts. Keeping a fan in your home and office. Find ways to manage stress, such as deep breathing, meditation, or journaling. Consider going to group therapy with other women who are having menopause symptoms. Ask your health care   provider about recommended  group therapy meetings. Eating and drinking  Eat a healthy, balanced diet that contains whole grains, lean protein, low-fat dairy, and plenty of fruits and vegetables. Your health care provider may recommend adding more soy to your diet. Foods that contain soy include tofu, tempeh, and soy milk. Eat plenty of foods that contain calcium and vitamin D for bone health. Items that are rich in calcium include low-fat milk, yogurt, beans, almonds, sardines, broccoli, and kale. Medicines Take over-the-counter and prescription medicines only as told by your health care provider. Talk with your health care provider before starting any herbal supplements. If prescribed, take vitamins and supplements as told by your health care provider. General instructions  Keep track of your menstrual periods, including: When they occur. How heavy they are and how long they last. How much time passes between periods. Keep track of your symptoms, noting when they start, how often you have them, and how long they last. Use vaginal lubricants or moisturizers to help with vaginal dryness and improve comfort during sex. Keep all follow-up visits. This is important. This includes any group therapy or counseling. Contact a health care provider if: You are still having menstrual periods after age 55. You have pain during sex. You have not had a period for 12 months and you develop vaginal bleeding. Get help right away if you have: Severe depression. Excessive vaginal bleeding. Pain when you urinate. A fast or irregular heartbeat (palpitations). Severe headaches. Abdominal pain or severe indigestion. Summary Menopause is a normal time of life when menstrual periods stop completely. It is usually defined as the absence of a menstrual period for 12 months without another medical cause. The transition to menopause (perimenopause) most often happens between the ages of 45 and 55 and can last for several years. Symptoms  can be managed through medicines, lifestyle changes, and complementary therapies such as acupuncture. Eat a balanced diet that is rich in nutrients to promote bone health and heart health and to manage symptoms during menopause. This information is not intended to replace advice given to you by your health care provider. Make sure you discuss any questions you have with your health care provider. Document Revised: 05/29/2020 Document Reviewed: 02/13/2020 Elsevier Patient Education  2023 [employer]  

## 2022-02-10 NOTE — Assessment & Plan Note (Signed)
?   Perimenopausal bcp did not help  Check labs  Consider  gyn

## 2022-02-11 ENCOUNTER — Other Ambulatory Visit (INDEPENDENT_AMBULATORY_CARE_PROVIDER_SITE_OTHER): Payer: BC Managed Care – PPO

## 2022-02-11 DIAGNOSIS — J0140 Acute pansinusitis, unspecified: Secondary | ICD-10-CM | POA: Diagnosis not present

## 2022-02-11 DIAGNOSIS — R232 Flushing: Secondary | ICD-10-CM | POA: Diagnosis not present

## 2022-02-11 LAB — CBC WITH DIFFERENTIAL/PLATELET
Basophils Absolute: 0 10*3/uL (ref 0.0–0.1)
Basophils Relative: 0.5 % (ref 0.0–3.0)
Eosinophils Absolute: 0.2 10*3/uL (ref 0.0–0.7)
Eosinophils Relative: 2.1 % (ref 0.0–5.0)
HCT: 37.4 % (ref 36.0–46.0)
Hemoglobin: 12.6 g/dL (ref 12.0–15.0)
Lymphocytes Relative: 23.8 % (ref 12.0–46.0)
Lymphs Abs: 1.8 10*3/uL (ref 0.7–4.0)
MCHC: 33.7 g/dL (ref 30.0–36.0)
MCV: 84 fl (ref 78.0–100.0)
Monocytes Absolute: 0.5 10*3/uL (ref 0.1–1.0)
Monocytes Relative: 7.2 % (ref 3.0–12.0)
Neutro Abs: 5 10*3/uL (ref 1.4–7.7)
Neutrophils Relative %: 66.4 % (ref 43.0–77.0)
Platelets: 257 10*3/uL (ref 150.0–400.0)
RBC: 4.45 Mil/uL (ref 3.87–5.11)
RDW: 16.5 % — ABNORMAL HIGH (ref 11.5–15.5)
WBC: 7.5 10*3/uL (ref 4.0–10.5)

## 2022-02-11 LAB — LIPID PANEL
Cholesterol: 203 mg/dL — ABNORMAL HIGH (ref 0–200)
HDL: 60.3 mg/dL (ref >39.00)
LDL Cholesterol: 116 mg/dL — ABNORMAL HIGH (ref 0–99)
NonHDL: 142.68
Total CHOL/HDL Ratio: 3
Triglycerides: 131 mg/dL (ref 0.0–149.0)
VLDL: 26.2 mg/dL (ref 0.0–40.0)

## 2022-02-11 LAB — COMPREHENSIVE METABOLIC PANEL
ALT: 11 U/L (ref 0–35)
AST: 16 U/L (ref 0–37)
Albumin: 4.1 g/dL (ref 3.5–5.2)
Alkaline Phosphatase: 55 U/L (ref 39–117)
BUN: 14 mg/dL (ref 6–23)
CO2: 26 mEq/L (ref 19–32)
Calcium: 9 mg/dL (ref 8.4–10.5)
Chloride: 106 mEq/L (ref 96–112)
Creatinine, Ser: 0.84 mg/dL (ref 0.40–1.20)
GFR: 85.57 mL/min (ref >60.00)
Glucose, Bld: 79 mg/dL (ref 70–99)
Potassium: 4.3 mEq/L (ref 3.5–5.1)
Sodium: 140 mEq/L (ref 135–145)
Total Bilirubin: 0.4 mg/dL (ref 0.2–1.2)
Total Protein: 6.9 g/dL (ref 6.0–8.3)

## 2022-02-11 LAB — TSH: TSH: 2.8 u[IU]/mL (ref 0.35–5.50)

## 2022-02-11 LAB — FOLLICLE STIMULATING HORMONE: FSH: 26.7 m[IU]/mL

## 2022-02-11 LAB — VITAMIN B12: Vitamin B-12: 416 pg/mL (ref 211–911)

## 2022-02-11 LAB — VITAMIN D 25 HYDROXY (VIT D DEFICIENCY, FRACTURES): VITD: 11.4 ng/mL — ABNORMAL LOW (ref 30.00–100.00)

## 2022-02-12 LAB — ESTRADIOL: Estradiol: 17 pg/mL

## 2022-02-19 ENCOUNTER — Other Ambulatory Visit: Payer: Self-pay | Admitting: Family Medicine

## 2022-02-19 DIAGNOSIS — F419 Anxiety disorder, unspecified: Secondary | ICD-10-CM

## 2022-02-22 IMAGING — MG MM DIGITAL DIAGNOSTIC UNILAT*L* W/ TOMO W/ CAD
6 series · 6 of 18 positions shown · non-contrast
Comparison: 10/28/2019 mammogram

CLINICAL DATA: 38-year-old female for further evaluation of
possible LEFT breast asymmetry on baseline screening mammogram.
History of bilateral breast reductions.

EXAM:
DIGITAL DIAGNOSTIC LEFT MAMMOGRAM WITH CAD AND TOMO
ULTRASOUND LEFT BREAST

[L MLO synth-2D]
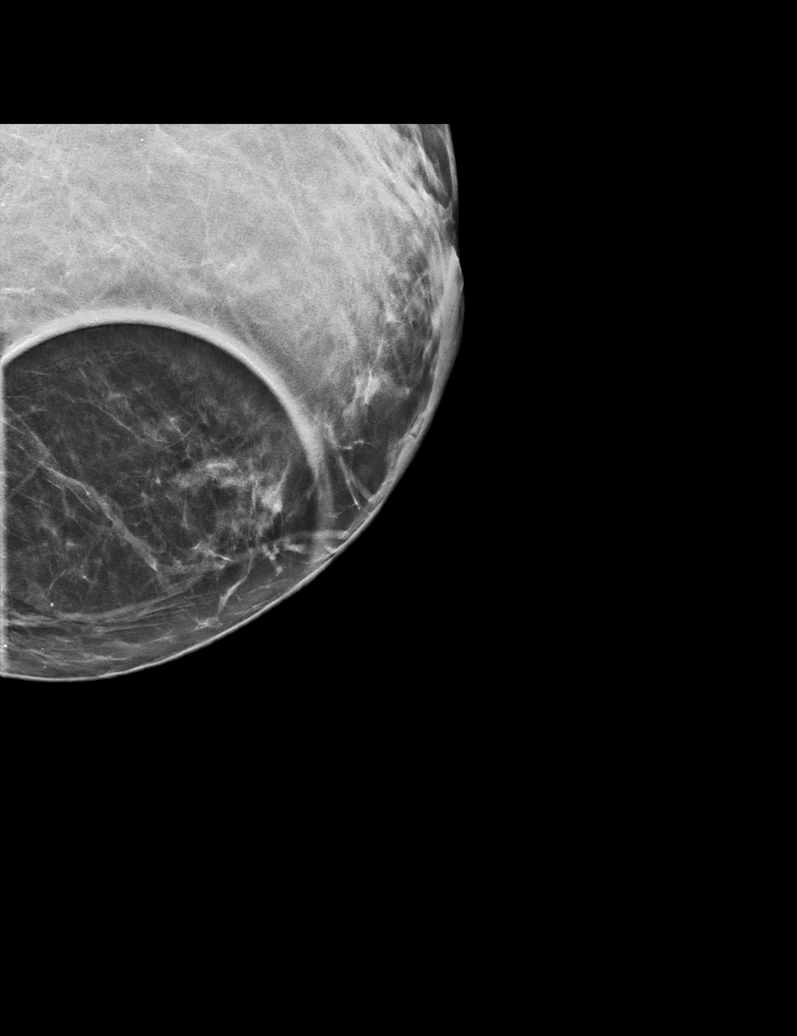

[L CC synth-2D]
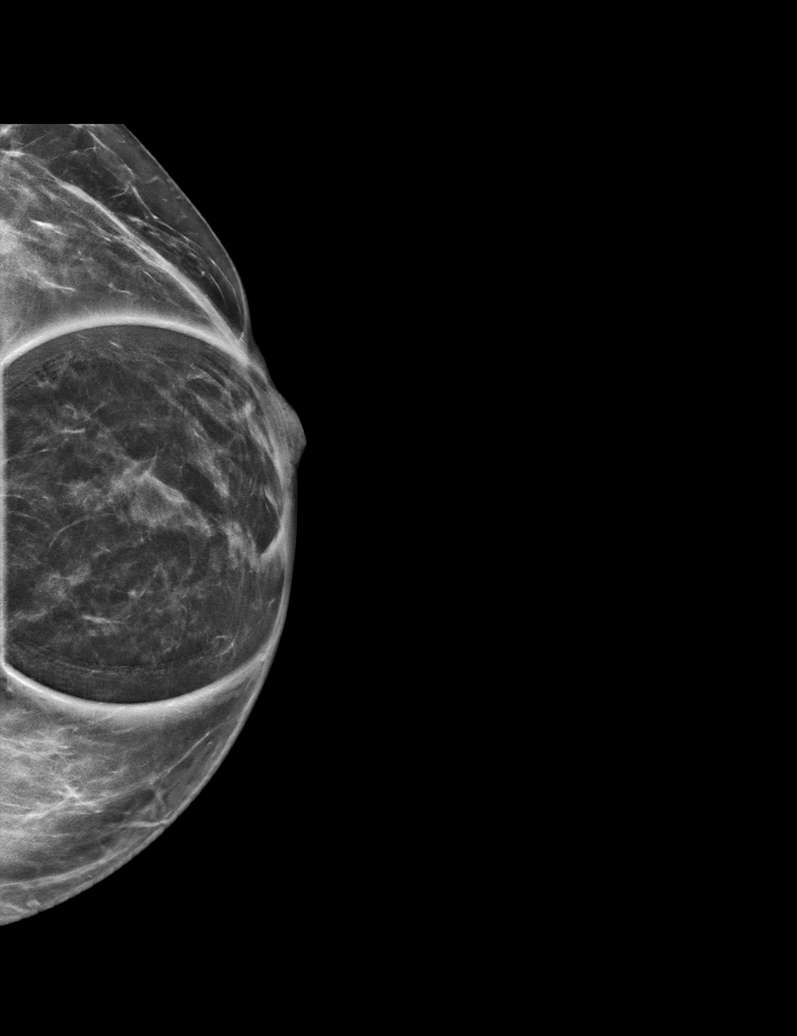

[L ML synth-2D]
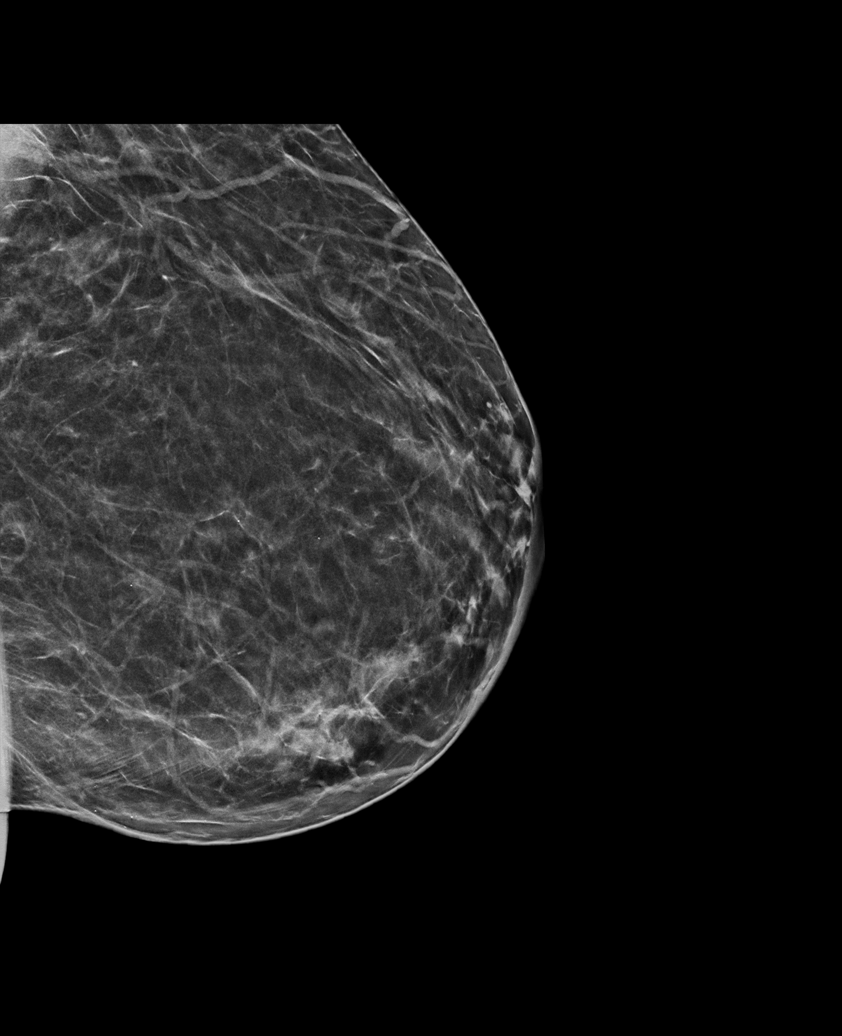

[L ML tomo · tomo slice 33/66.0]
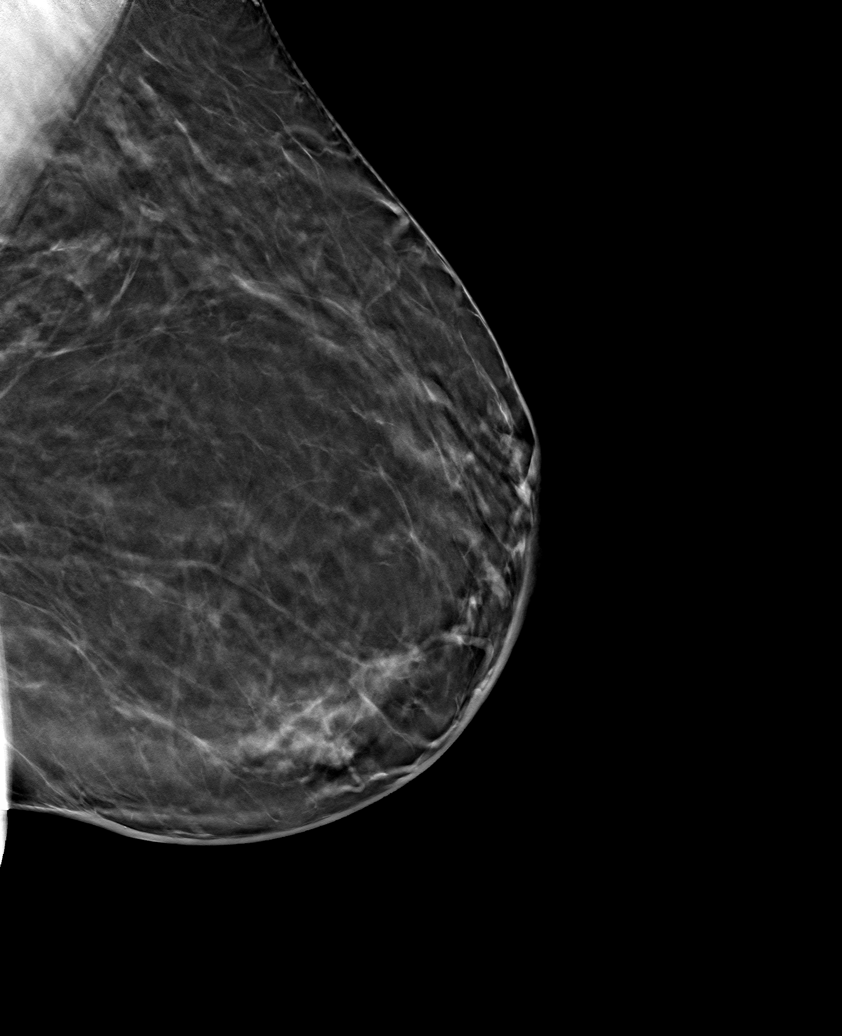

[L MLO tomo · tomo slice 27/54.0]
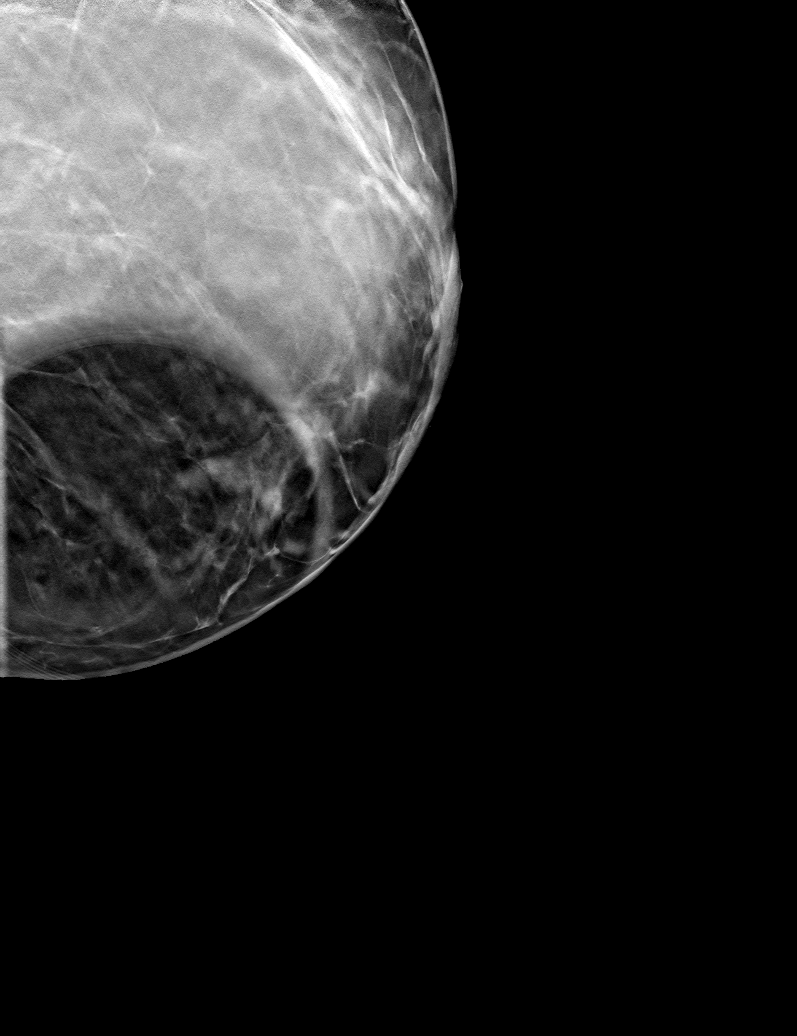

[L CC tomo · tomo slice 29/56.0]
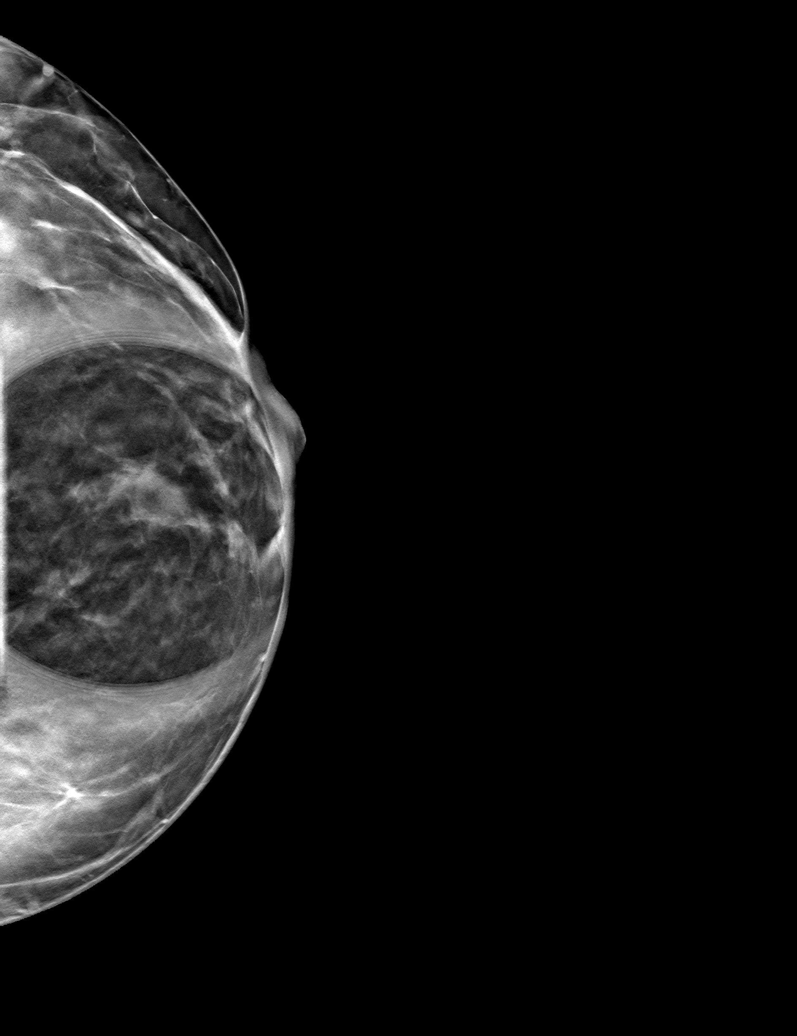

[6 of 18 positions shown; findings below may reference images not displayed]

ACR Breast Density Category b: There are scattered areas of
fibroglandular density.
FINDINGS: 2D/3D full field and spot compression views of the LEFT breast
demonstrate minimal asymmetry in the RETROAREOLAR region. No
discrete mass or worrisome calcifications are identified.

Mammographic images were processed with CAD.

On physical exam, periareolar reduction scars.

Targeted ultrasound is performed, showing normal fibroglandular
tissue in the RETROAREOLAR region without solid or cystic mass,
nonsurgical distortion or abnormal shadowing.
IMPRESSION: 1. No persistent suspicious mammographic or sonographic abnormality
in the LEFT breast, at the site of the screening study finding. The
mammographic appearance is compatible with prior reduction changes.

RECOMMENDATION:
Bilateral screening mammogram in 1 year.

I have discussed the findings and recommendations with the patient.
If applicable, a reminder letter will be sent to the patient
regarding the next appointment.

BI-RADS CATEGORY  2: Benign.

## 2022-02-22 IMAGING — US US BREAST*L* LIMITED INC AXILLA
1 series · 2 of 2 positions shown · non-contrast
Comparison: 10/28/2019 mammogram

CLINICAL DATA: 38-year-old female for further evaluation of
possible LEFT breast asymmetry on baseline screening mammogram.
History of bilateral breast reductions.

EXAM:
DIGITAL DIAGNOSTIC LEFT MAMMOGRAM WITH CAD AND TOMO
ULTRASOUND LEFT BREAST

[Series 1: us breast*left* limited inc axilla · 0.07mm/px · 2 of 2 slices shown]
[im 1/2]
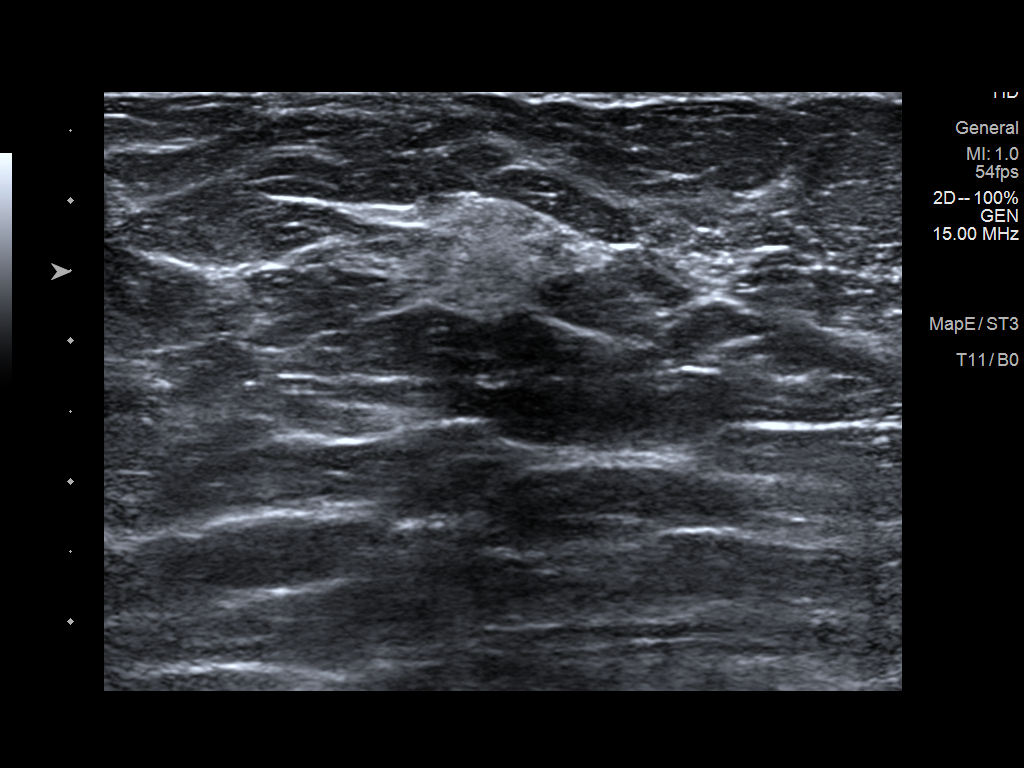
[im 2/2]
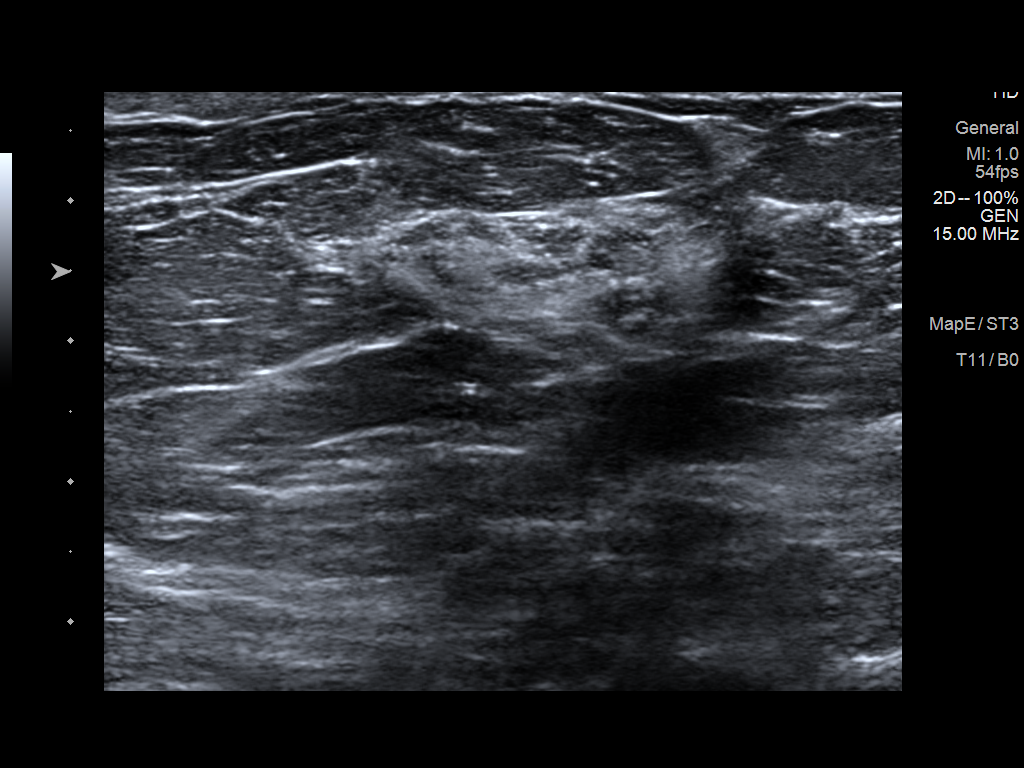

[2 of 2 positions shown; findings below may reference images not displayed]

ACR Breast Density Category b: There are scattered areas of
fibroglandular density.
FINDINGS: 2D/3D full field and spot compression views of the LEFT breast
demonstrate minimal asymmetry in the RETROAREOLAR region. No
discrete mass or worrisome calcifications are identified.

Mammographic images were processed with CAD.

On physical exam, periareolar reduction scars.

Targeted ultrasound is performed, showing normal fibroglandular
tissue in the RETROAREOLAR region without solid or cystic mass,
nonsurgical distortion or abnormal shadowing.
IMPRESSION: 1. No persistent suspicious mammographic or sonographic abnormality
in the LEFT breast, at the site of the screening study finding. The
mammographic appearance is compatible with prior reduction changes.

RECOMMENDATION:
Bilateral screening mammogram in 1 year.

I have discussed the findings and recommendations with the patient.
If applicable, a reminder letter will be sent to the patient
regarding the next appointment.

BI-RADS CATEGORY  2: Benign.

## 2022-06-20 ENCOUNTER — Telehealth: Payer: Self-pay | Admitting: Family Medicine

## 2022-06-20 DIAGNOSIS — E039 Hypothyroidism, unspecified: Secondary | ICD-10-CM

## 2022-06-20 MED ORDER — LEVOTHYROXINE SODIUM 137 MCG PO TABS: 137.0000 ug | ORAL_TABLET | Freq: Every day | ORAL | 0 refills | Status: DC

## 2022-06-20 NOTE — Telephone Encounter (Signed)
Spoke with patient and advised that rx has been sent into pharmacy.

## 2022-06-20 NOTE — Telephone Encounter (Signed)
Medication:   levothyroxine (SYNTHROID) 137 MCG tablet [[id number]]   Has the patient contacted their pharmacy? No. (If no, request that the patient contact the pharmacy for the refill.) (If yes, when and what did the pharmacy advise?)  Preferred Pharmacy (with phone number or street name):   [id number] [address], [address] [place] [address] Phone: [phone]  Fax: [phone]   Agent: Please be advised that RX refills may take up to 3 business days. We ask that you follow-up with your pharmacy.

## 2022-09-15 ENCOUNTER — Other Ambulatory Visit: Payer: Self-pay | Admitting: Family Medicine

## 2022-09-15 DIAGNOSIS — E039 Hypothyroidism, unspecified: Secondary | ICD-10-CM

## 2023-08-08 ENCOUNTER — Ambulatory Visit: Payer: BC Managed Care – PPO | Admitting: Family Medicine

## 2023-08-08 ENCOUNTER — Encounter: Payer: Self-pay | Admitting: Family Medicine

## 2023-08-08 ENCOUNTER — Other Ambulatory Visit (HOSPITAL_COMMUNITY)
Admission: RE | Admit: 2023-08-08 | Discharge: 2023-08-08 | Disposition: A | Discharge status: Home / Self Care | Payer: BC Managed Care – PPO | Source: Ambulatory Visit | Attending: Family Medicine | Admitting: Family Medicine

## 2023-08-08 VITALS — BP 142/80 | HR 89 | Temp 97.6°F | Resp 18 | Wt 223.0 lb

## 2023-08-08 DIAGNOSIS — H538 Other visual disturbances: Secondary | ICD-10-CM | POA: Diagnosis not present

## 2023-08-08 DIAGNOSIS — Z7251 High risk heterosexual behavior: Secondary | ICD-10-CM | POA: Diagnosis present

## 2023-08-08 DIAGNOSIS — Z13220 Encounter for screening for lipoid disorders: Secondary | ICD-10-CM

## 2023-08-08 DIAGNOSIS — E039 Hypothyroidism, unspecified: Secondary | ICD-10-CM

## 2023-08-08 DIAGNOSIS — F988 Other specified behavioral and emotional disorders with onset usually occurring in childhood and adolescence: Secondary | ICD-10-CM

## 2023-08-08 DIAGNOSIS — Z0001 Encounter for general adult medical examination with abnormal findings: Secondary | ICD-10-CM | POA: Diagnosis not present

## 2023-08-08 DIAGNOSIS — Z23 Encounter for immunization: Secondary | ICD-10-CM | POA: Diagnosis not present

## 2023-08-08 DIAGNOSIS — F419 Anxiety disorder, unspecified: Secondary | ICD-10-CM

## 2023-08-08 DIAGNOSIS — B9689 Other specified bacterial agents as the cause of diseases classified elsewhere: Secondary | ICD-10-CM | POA: Insufficient documentation

## 2023-08-08 DIAGNOSIS — N760 Acute vaginitis: Secondary | ICD-10-CM | POA: Insufficient documentation

## 2023-08-08 DIAGNOSIS — Z0000 Encounter for general adult medical examination without abnormal findings: Secondary | ICD-10-CM

## 2023-08-08 MED ORDER — ACETAMINOPHEN 325 MG PO TABS
650.0000 mg | ORAL_TABLET | Freq: Four times a day (QID) | ORAL | Status: AC | PRN
  Administered 2023-08-08: 650 mg via ORAL

## 2023-08-08 MED ORDER — SERTRALINE HCL 25 MG PO TABS: 25.0000 mg | ORAL_TABLET | Freq: Every day | ORAL | 3 refills | Status: DC

## 2023-08-08 NOTE — Progress Notes (Signed)
Established Patient Office Visit  Subjective   Patient ID: Erin [name], female    DOB: Sep 01, 1981  Age: 42 y.o. MRN: [id number]  Chief Complaint  Patient presents with   Annual Exam    Concerns/ questions: none Flu shot today: will discuss      HPI Discussed the use of AI scribe software for clinical note transcription with the patient, who gave verbal consent to proceed.  History of Present Illness   The patient, an educator with a history of anxiety, presents with concerns about her mental health and physical well-being. She reports feeling "weird all the time" and suspects she may have adult ADHD, as she had it in childhood. She also expresses concerns about her menstrual cycle, which she describes as "crazy," but she is unsure if this is due to age or another underlying issue.  The patient has been experiencing significant stress due to her job as an Geophysicist/field seismologist principal, which she believes is contributing to her elevated blood pressure. She expresses dissatisfaction with her current job and is considering retirement in the near future. She also mentions a desire to return to regular therapy, as she did not connect well with her previous therapist.  The patient has a history of loss, with her father passing away when she was 82 years old, and more recently, the loss of her stepfather to cancer and her grandmother to dementia. She expresses that she has not properly grieved these losses and believes this may be contributing to her current mental health concerns. She reports having recurring dreams related to her father and his house, which she associates with feelings of abandonment.  The patient is currently taking Sertraline but admits to not taking it consistently. She expresses a desire to manage her mental health more effectively and is open to increasing her medication dosage if necessary. She also requests a referral for an eye doctor, as her previous one has retired.  In terms  of physical health, the patient requests a full physical examination, including STD testing and a thyroid level check. She also expresses a desire to improve her physical fitness by returning to the gym. She is currently taking vitamin D supplements and wishes to continue doing so. She also requests a flu shot during the consultation.      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   Preventative health care 12/20/2020   Seasonal allergies 12/20/2020   Acne vulgaris 12/20/2020   Morbid obesity (HCC) 12/20/2020   Viral gastroenteritis 10/16/2017   URI (upper respiratory infection) 07/28/2016   Sinusitis, acute maxillary 10/29/2014   Headache 03/30/2013   Food sensitivity headache 04/03/2011   Perennial allergic rhinitis with seasonal variation 04/01/2011   POSTSURGICAL HYPOTHYROIDISM 05/28/2009   Anxiety and depression 05/28/2009   Asthma, mild intermittent, well-controlled 06/23/2008   GOITER 03/07/2008   ELEVATED BLOOD PRESSURE WITHOUT DIAGNOSIS OF HYPERTENSION 01/21/2008   Past Medical History:  Diagnosis Date   Abortion history    x3   Asthma    Elevated blood pressure    Hypertension    Past Surgical History:  Procedure Laterality Date   BREAST SURGERY     Breast reduction   INDUCED ABORTION     x3   REDUCTION MAMMAPLASTY     THYROIDECTOMY  10/30/08   [facility]   Social History   Tobacco Use   Smoking status: Never   Smokeless tobacco: Never  Substance Use Topics  Alcohol use: Yes    Alcohol/week: 3.0 standard drinks of alcohol    Types: 3 Standard drinks or equivalent per week   Drug use: No   Social History   Socioeconomic History   Marital status: Single    Spouse name: n/a   Number of children: 0   Years of education: Master's   Highest education level: Not on file  Occupational History   Occupation: Wellsite geologist: [employer]  Tobacco Use   Smoking status: Never    Smokeless tobacco: Never  Substance and Sexual Activity   Alcohol use: Yes    Alcohol/week: 3.0 standard drinks of alcohol    Types: 3 Standard drinks or equivalent per week   Drug use: No   Sexual activity: Yes    Partners: Male    Birth control/protection: Condom  Other Topics Concern   Not on file  Social History Narrative   PhD -- Lives alone.   Social Determinants of Health   Financial Resource Strain: Not on file  Food Insecurity: Not on file  Transportation Needs: Not on file  Physical Activity: Not on file  Stress: Not on file  Social Connections: Not on file  Intimate Partner Violence: Not on file   Family Status  Relation Name Status   Mother  Alive   Father  Deceased at age 44       AMI   MGM  Deceased at age 33   MGF  Deceased   PGM  Deceased   PGF  Deceased   Mat Aunt  (Not Specified)   Other  (Not Specified)  No partnership data on file   Family History  Problem Relation Age of Onset   Hypertension Mother    Coronary artery disease Father    Stroke Father    Dementia Maternal Grandmother    Diabetes Maternal Aunt    Lung cancer Other        great uncle   Allergies  Allergen Reactions   Shellfish Allergy Anaphylaxis   Sulfonamide Derivatives       Review of Systems  Constitutional:  Negative for chills, fever and malaise/fatigue.  HENT:  Negative for congestion and hearing loss.   Eyes:  Negative for discharge.  Respiratory:  Negative for cough, sputum production and shortness of breath.   Cardiovascular:  Negative for chest pain, palpitations and leg swelling.  Gastrointestinal:  Negative for abdominal pain, blood in stool, constipation, diarrhea, heartburn, nausea and vomiting.  Genitourinary:  Negative for dysuria, frequency, hematuria and urgency.  Musculoskeletal:  Negative for back pain, falls and myalgias.  Skin:  Negative for rash.  Neurological:  Negative for dizziness, sensory change, loss of consciousness, weakness and headaches.   Endo/Heme/Allergies:  Negative for environmental allergies. Does not bruise/bleed easily.  Psychiatric/Behavioral:  Negative for depression and suicidal ideas. The patient is not nervous/anxious and does not have insomnia.       Objective:     BP (!) 142/80 (BP Location: Left Arm, Patient Position: Sitting, Cuff Size: Large)   Pulse 89   Temp 97.6 F (36.4 C) (Temporal)   Resp 18   Wt 223 lb (101.2 kg)   SpO2 97%   BMI 32.93 kg/m  BP Readings from Last 3 Encounters:  08/08/23 (!) 142/80  02/10/22 126/88  05/11/21 126/86   Wt Readings from Last 3 Encounters:  08/08/23 223 lb (101.2 kg)  02/10/22 240 lb 3.2 oz (109 kg)  05/11/21 242 lb (109.8 kg)  SpO2 Readings from Last 3 Encounters:  08/08/23 97%  02/10/22 98%  05/11/21 98%      Physical Exam Vitals and nursing note reviewed.  Constitutional:      General: She is not in acute distress.    Appearance: Normal appearance. She is well-developed.  HENT:     Head: Normocephalic and atraumatic.     Right Ear: Tympanic membrane, ear canal and external ear normal. There is no impacted cerumen.     Left Ear: Tympanic membrane, ear canal and external ear normal. There is no impacted cerumen.     Nose: Nose normal.     Mouth/Throat:     Mouth: Mucous membranes are moist.     Pharynx: Oropharynx is clear. No oropharyngeal exudate or posterior oropharyngeal erythema.  Eyes:     General: No scleral icterus.       Right eye: No discharge.        Left eye: No discharge.     Conjunctiva/sclera: Conjunctivae normal.     Pupils: Pupils are equal, round, and reactive to light.  Neck:     Thyroid: No thyromegaly or thyroid tenderness.     Vascular: No JVD.  Cardiovascular:     Rate and Rhythm: Normal rate and regular rhythm.     Heart sounds: Normal heart sounds. No murmur heard. Pulmonary:     Effort: Pulmonary effort is normal. No respiratory distress.     Breath sounds: Normal breath sounds.  Abdominal:     General:  Bowel sounds are normal. There is no distension.     Palpations: Abdomen is soft. There is no mass.     Tenderness: There is no abdominal tenderness. There is no guarding or rebound.  Genitourinary:    Vagina: Normal.  Musculoskeletal:        General: Normal range of motion.     Cervical back: Normal range of motion and neck supple.     Right lower leg: No edema.     Left lower leg: No edema.  Lymphadenopathy:     Cervical: No cervical adenopathy.  Skin:    General: Skin is warm and dry.     Findings: No erythema or rash.  Neurological:     Mental Status: She is alert and oriented to person, place, and time.     Cranial Nerves: No cranial nerve deficit.     Deep Tendon Reflexes: Reflexes are normal and symmetric.  Psychiatric:        Mood and Affect: Mood normal.        Behavior: Behavior normal.        Thought Content: Thought content normal.        Judgment: Judgment normal.      No results found for any visits on 08/08/23.  Last CBC Lab Results  Component Value Date   WBC 7.5 02/11/2022   HGB 12.6 02/11/2022   HCT 37.4 02/11/2022   MCV 84.0 02/11/2022   MCH 29.1 06/18/2013   RDW 16.5 (H) 02/11/2022   PLT 257.0 02/11/2022   Last metabolic panel Lab Results  Component Value Date   GLUCOSE 79 02/11/2022   NA 140 02/11/2022   K 4.3 02/11/2022   CL 106 02/11/2022   CO2 26 02/11/2022   BUN 14 02/11/2022   CREATININE 0.84 02/11/2022   GFR 85.57 02/11/2022   CALCIUM 9.0 02/11/2022   PROT 6.9 02/11/2022   ALBUMIN 4.1 02/11/2022   BILITOT 0.4 02/11/2022   ALKPHOS 55 02/11/2022  AST 16 02/11/2022   ALT 11 02/11/2022   Last lipids Lab Results  Component Value Date   CHOL 203 (H) 02/11/2022   HDL 60.30 02/11/2022   LDLCALC 116 (H) 02/11/2022   TRIG 131.0 02/11/2022   CHOLHDL 3 02/11/2022   Last hemoglobin A1c Lab Results  Component Value Date   HGBA1C 5.9 12/17/2020   Last thyroid functions Lab Results  Component Value Date   TSH 2.80 02/11/2022    T4TOTAL 8.2 10/27/2016   Last vitamin D Lab Results  Component Value Date   VD25OH 11.40 (L) 02/11/2022   Last vitamin B12 and Folate Lab Results  Component Value Date   VITAMINB12 416 02/11/2022      The 10-year ASCVD risk score (Arnett DK, et al., 2019) is: 1.3%    Assessment & Plan:   Problem List Items Addressed This Visit       Unprioritized   Preventative health care - Primary   Relevant Orders   CBC with Differential/Platelet   Comprehensive metabolic panel   Lipid panel   TSH   HSV(herpes simplex vrs) 1+2 ab-IgG   RPR   HIV Antibody (routine testing w rflx)   Vitamin B12   VITAMIN D 25 Hydroxy (Vit-D Deficiency, Fractures)   Other Visit Diagnoses     Need for influenza vaccination       Relevant Medications   acetaminophen (TYLENOL) tablet 650 mg   Other Relevant Orders   Flu vaccine trivalent PF, 6mos and older(Flulaval,Afluria,Fluarix,Fluzone) (Completed)   Hypothyroidism, unspecified type       Relevant Orders   TSH   Anxiety disorder, unspecified       Relevant Medications   sertraline (ZOLOFT) 25 MG tablet   Attention deficit disorder, unspecified type       Relevant Orders   Ambulatory referral to Behavioral Health   Blurry vision       Relevant Orders   Ambulatory referral to Ophthalmology   High risk heterosexual behavior       Relevant Orders   Cervicovaginal ancillary only( [employer])     Assessment and Plan    Anxiety, Possible Adult ADHD   She exhibits significant anxiety, exacerbated by job stress and personal losses, and presents possible adult ADHD, noting a history of childhood ADHD. Her sertraline use has been inconsistent. We discussed the importance of consistent medication use and the benefits of therapy. We will refill sertraline, encourage its consistent use, refer her for ADD testing, and to a new counselor, Aurther Loft, via virtual sessions.  Hypertension   She has borderline elevated blood pressure, likely related to job  stress as an Geophysicist/field seismologist principal, with no home monitoring reported. We discussed the importance of home monitoring and decided to recheck her blood pressure before she leaves today, encouraging her to monitor her blood pressure at home.  Thyroid Disorder   She requested a thyroid level check as part of her physical. We discussed the importance of regular thyroid function monitoring and will order thyroid function tests.  Vitamin D Deficiency   She needs to restart vitamin D supplementation. We discussed the importance of maintaining adequate vitamin D levels and will check her vitamin D levels.  STD Screening   She requested a comprehensive STD screening, including HIV, syphilis, hepatitis, chlamydia, gonorrhea, bacterial vaginosis, and yeast. We explained the self-swab procedure for chlamydia, gonorrhea, bacterial vaginosis, and yeast, and will order blood tests for HIV, syphilis, and hepatitis.  Eye Care   She requested a referral to  a new eye doctor due to the retirement of her previous provider. We discussed the importance of regular eye exams and will provide a referral to a new eye doctor.  General Health Maintenance   She requested a comprehensive physical including a flu shot and reported irregular menstrual cycles possibly related to age. We discussed potential premenopausal symptoms and the benefits of regular exercise. We will administer a flu shot, encourage regular exercise, and discuss potential premenopausal symptoms.  Follow-up   We will follow up in one month to assess her response to sertraline and the potential need for dosage adjustment.        Return in about 1 year (around 08/07/2024), or if symptoms worsen or fail to improve.    Donato Schultz, DO

## 2023-08-08 NOTE — Patient Instructions (Signed)
Preventive Care 73-44 Years Old, Female  Preventive care refers to lifestyle choices and visits with your health care provider that can promote health and wellness. Preventive care visits are also called wellness exams.  What can I expect for my preventive care visit?  Counseling  Your health care provider may ask you questions about your:  Medical history, including:  Past medical problems.  Family medical history.  Pregnancy history.  Current health, including:  Menstrual cycle.  Method of birth control.  Emotional well-being.  Home life and relationship well-being.  Sexual activity and sexual health.  Lifestyle, including:  Alcohol, nicotine or tobacco, and drug use.  Access to firearms.  Diet, exercise, and sleep habits.  Work and work Astronomer.  Sunscreen use.  Safety issues such as seatbelt and bike helmet use.  Physical exam  Your health care provider will check your:  Height and weight. These may be used to calculate your BMI (body mass index). BMI is a measurement that tells if you are at a healthy weight.  Waist circumference. This measures the distance around your waistline. This measurement also tells if you are at a healthy weight and may help predict your risk of certain diseases, such as type 2 diabetes and high blood pressure.  Heart rate and blood pressure.  Body temperature.  Skin for abnormal spots.  What immunizations do I need?    Vaccines are usually given at various ages, according to a schedule. Your health care provider will recommend vaccines for you based on your age, medical history, and lifestyle or other factors, such as travel or where you work.  What tests do I need?  Screening  Your health care provider may recommend screening tests for certain conditions. This may include:  Lipid and cholesterol levels.  Diabetes screening. This is done by checking your blood sugar (glucose) after you have not eaten for a while (fasting).  Pelvic exam and Pap test.  Hepatitis B test.  Hepatitis C  test.  HIV (human immunodeficiency virus) test.  STI (sexually transmitted infection) testing, if you are at risk.  Lung cancer screening.  Colorectal cancer screening.  Mammogram. Talk with your health care provider about when you should start having regular mammograms. This may depend on whether you have a family history of breast cancer.  BRCA-related cancer screening. This may be done if you have a family history of breast, ovarian, tubal, or peritoneal cancers.  Bone density scan. This is done to screen for osteoporosis.  Talk with your health care provider about your test results, treatment options, and if necessary, the need for more tests.  Follow these instructions at home:  Eating and drinking    Eat a diet that includes fresh fruits and vegetables, whole grains, lean protein, and low-fat dairy products.  Take vitamin and mineral supplements as recommended by your health care provider.  Do not drink alcohol if:  Your health care provider tells you not to drink.  You are pregnant, may be pregnant, or are planning to become pregnant.  If you drink alcohol:  Limit how much you have to 0-1 drink a day.  Know how much alcohol is in your drink. In the [place] one drink equals one 12 oz bottle of beer (355 mL), one 5 oz glass of wine (148 mL), or one 1 oz glass of hard liquor (44 mL).  Lifestyle  Brush your teeth every morning and night with fluoride toothpaste. Floss one time each day.  Exercise for at least  30 minutes 5 or more days each week.  Do not use any products that contain nicotine or tobacco. These products include cigarettes, chewing tobacco, and vaping devices, such as e-cigarettes. If you need help quitting, ask your health care provider.  Do not use drugs.  If you are sexually active, practice safe sex. Use a condom or other form of protection to prevent STIs.  If you do not wish to become pregnant, use a form of birth control. If you plan to become pregnant, see your health care provider for a  prepregnancy visit.  Take aspirin only as told by your health care provider. Make sure that you understand how much to take and what form to take. Work with your health care provider to find out whether it is safe and beneficial for you to take aspirin daily.  Find healthy ways to manage stress, such as:  Meditation, yoga, or listening to music.  Journaling.  Talking to a trusted person.  Spending time with friends and family.  Minimize exposure to UV radiation to reduce your risk of skin cancer.  Safety  Always wear your seat belt while driving or riding in a vehicle.  Do not drive:  If you have been drinking alcohol. Do not ride with someone who has been drinking.  When you are tired or distracted.  While texting.  If you have been using any mind-altering substances or drugs.  Wear a helmet and other protective equipment during sports activities.  If you have firearms in your house, make sure you follow all gun safety procedures.  Seek help if you have been physically or sexually abused.  What's next?  Visit your health care provider once a year for an annual wellness visit.  Ask your health care provider how often you should have your eyes and teeth checked.  Stay up to date on all vaccines.  This information is not intended to replace advice given to you by your health care provider. Make sure you discuss any questions you have with your health care provider.  Document Revised: 02/24/2021 Document Reviewed: 02/24/2021  Elsevier Patient Education  2024 [employer].

## 2023-08-09 LAB — CBC WITH DIFFERENTIAL/PLATELET
Basophils Absolute: 0.1 10*3/uL (ref 0.0–0.1)
Basophils Relative: 1.8 % (ref 0.0–3.0)
Eosinophils Absolute: 0.2 10*3/uL (ref 0.0–0.7)
Eosinophils Relative: 2.2 % (ref 0.0–5.0)
HCT: 36.5 % (ref 36.0–46.0)
Hemoglobin: 11.8 g/dL — ABNORMAL LOW (ref 12.0–15.0)
Lymphocytes Relative: 30.3 % (ref 12.0–46.0)
Lymphs Abs: 2.2 10*3/uL (ref 0.7–4.0)
MCHC: 32.2 g/dL (ref 30.0–36.0)
MCV: 86.1 fL (ref 78.0–100.0)
Monocytes Absolute: 0.6 10*3/uL (ref 0.1–1.0)
Monocytes Relative: 8.6 % (ref 3.0–12.0)
Neutro Abs: 4.2 10*3/uL (ref 1.4–7.7)
Neutrophils Relative %: 57.1 % (ref 43.0–77.0)
Platelets: 334 10*3/uL (ref 150.0–400.0)
RBC: 4.25 Mil/uL (ref 3.87–5.11)
RDW: 17.1 % — ABNORMAL HIGH (ref 11.5–15.5)
WBC: 7.3 10*3/uL (ref 4.0–10.5)

## 2023-08-09 LAB — LIPID PANEL
Cholesterol: 210 mg/dL — ABNORMAL HIGH (ref 0–200)
HDL: 80.4 mg/dL (ref >39.00)
LDL Cholesterol: 113 mg/dL — ABNORMAL HIGH (ref 0–99)
NonHDL: 129.91
Total CHOL/HDL Ratio: 3
Triglycerides: 83 mg/dL (ref 0.0–149.0)
VLDL: 16.6 mg/dL (ref 0.0–40.0)

## 2023-08-09 LAB — COMPREHENSIVE METABOLIC PANEL
ALT: 8 U/L (ref 0–35)
AST: 13 U/L (ref 0–37)
Albumin: 4.2 g/dL (ref 3.5–5.2)
Alkaline Phosphatase: 68 U/L (ref 39–117)
BUN: 18 mg/dL (ref 6–23)
CO2: 29 meq/L (ref 19–32)
Calcium: 9 mg/dL (ref 8.4–10.5)
Chloride: 104 meq/L (ref 96–112)
Creatinine, Ser: 0.82 mg/dL (ref 0.40–1.20)
GFR: 87.16 mL/min (ref >60.00)
Glucose, Bld: 70 mg/dL (ref 70–99)
Potassium: 4.1 meq/L (ref 3.5–5.1)
Sodium: 140 meq/L (ref 135–145)
Total Bilirubin: 0.3 mg/dL (ref 0.2–1.2)
Total Protein: 6.8 g/dL (ref 6.0–8.3)

## 2023-08-09 LAB — HSV(HERPES SIMPLEX VRS) I + II AB-IGG
HAV 1 IGG,TYPE SPECIFIC AB: 4.14 {index} — ABNORMAL HIGH
HSV 2 IGG,TYPE SPECIFIC AB: <0.9 {index}

## 2023-08-09 LAB — RPR: RPR Ser Ql: NONREACTIVE

## 2023-08-09 LAB — VITAMIN D 25 HYDROXY (VIT D DEFICIENCY, FRACTURES): VITD: 13.4 ng/mL — ABNORMAL LOW (ref 30.00–100.00)

## 2023-08-09 LAB — VITAMIN B12: Vitamin B-12: 278 pg/mL (ref 211–911)

## 2023-08-09 LAB — TSH: TSH: 7 u[IU]/mL — ABNORMAL HIGH (ref 0.35–5.50)

## 2023-08-09 LAB — HIV ANTIBODY (ROUTINE TESTING W REFLEX): HIV 1&2 Ab, 4th Generation: NONREACTIVE

## 2023-08-14 LAB — CERVICOVAGINAL ANCILLARY ONLY
Bacterial Vaginitis (gardnerella): POSITIVE — AB
Candida Glabrata: NEGATIVE
Candida Vaginitis: NEGATIVE
Chlamydia: NEGATIVE
Comment: NEGATIVE
Comment: NEGATIVE
Comment: NEGATIVE
Comment: NEGATIVE
Comment: NEGATIVE
Comment: NORMAL
Neisseria Gonorrhea: NEGATIVE
Trichomonas: NEGATIVE

## 2023-08-15 ENCOUNTER — Other Ambulatory Visit: Payer: Self-pay

## 2023-08-15 MED ORDER — LEVOTHYROXINE SODIUM 150 MCG PO TABS: 150.0000 ug | ORAL_TABLET | Freq: Every day | ORAL | 2 refills | Status: DC

## 2023-08-15 MED ORDER — METRONIDAZOLE 500 MG PO TABS: 500.0000 mg | ORAL_TABLET | Freq: Two times a day (BID) | ORAL | 0 refills | Status: DC

## 2023-10-04 ENCOUNTER — Ambulatory Visit: Payer: 59 | Admitting: Psychology

## 2023-10-04 DIAGNOSIS — F89 Unspecified disorder of psychological development: Secondary | ICD-10-CM

## 2023-10-04 NOTE — Progress Notes (Signed)
Ms. Lamarque was observed to be driving during the time of the appointment, and she noted she would be driving for another 30 minutes. This Clinical research associate explained the safety concerns of her driving during the appointment and recommended the appointment be rescheduled. She was agreeable with this plan. She was rescheduled for 10/25/2023 at 4pm. She denied having any other issues or concerns.

## 2023-10-25 ENCOUNTER — Ambulatory Visit: Payer: 59 | Admitting: Psychology

## 2023-11-09 ENCOUNTER — Other Ambulatory Visit: Payer: Self-pay | Admitting: Family Medicine

## 2024-08-26 ENCOUNTER — Encounter: Payer: Self-pay | Admitting: Family Medicine

## 2024-08-26 ENCOUNTER — Other Ambulatory Visit (HOSPITAL_COMMUNITY)
Admission: RE | Admit: 2024-08-26 | Discharge: 2024-08-26 | Disposition: A | Discharge status: Home / Self Care | Source: Ambulatory Visit | Attending: Family Medicine | Admitting: Family Medicine

## 2024-08-26 ENCOUNTER — Ambulatory Visit: Payer: Self-pay | Admitting: Family Medicine

## 2024-08-26 VITALS — BP 132/100 | HR 83 | Temp 98.3°F | Resp 18 | Ht 69.0 in | Wt 231.0 lb

## 2024-08-26 DIAGNOSIS — F419 Anxiety disorder, unspecified: Secondary | ICD-10-CM | POA: Diagnosis not present

## 2024-08-26 DIAGNOSIS — Z1211 Encounter for screening for malignant neoplasm of colon: Secondary | ICD-10-CM

## 2024-08-26 DIAGNOSIS — M79672 Pain in left foot: Secondary | ICD-10-CM

## 2024-08-26 DIAGNOSIS — H538 Other visual disturbances: Secondary | ICD-10-CM

## 2024-08-26 DIAGNOSIS — Z0000 Encounter for general adult medical examination without abnormal findings: Secondary | ICD-10-CM | POA: Diagnosis not present

## 2024-08-26 DIAGNOSIS — F988 Other specified behavioral and emotional disorders with onset usually occurring in childhood and adolescence: Secondary | ICD-10-CM

## 2024-08-26 DIAGNOSIS — N926 Irregular menstruation, unspecified: Secondary | ICD-10-CM

## 2024-08-26 MED ORDER — SERTRALINE HCL 50 MG PO TABS: 50.0000 mg | ORAL_TABLET | Freq: Every day | ORAL | 3 refills | Status: AC

## 2024-08-26 MED ORDER — DROSPIRENONE-ETHINYL ESTRADIOL 3-0.02 MG PO TABS: 1.0000 | ORAL_TABLET | Freq: Every day | ORAL | 11 refills | Status: AC

## 2024-08-26 NOTE — Progress Notes (Unsigned)
 Subjective:    Patient ID: Erin [name], female    DOB: June 02, 1981, 43 y.o.   MRN: [id number]  Chief Complaint  Patient presents with   Annual Exam    Pt states fasting    HPI Patient is in today for cpe.  Discussed the use of AI scribe software for clinical note transcription with the patient, who gave verbal consent to proceed.  History of Present Illness     Past Medical History:  Diagnosis Date   Abortion history    x3   Asthma    Elevated blood pressure    Hypertension     Past Surgical History:  Procedure Laterality Date   BREAST SURGERY     Breast reduction   INDUCED ABORTION     x3   REDUCTION MAMMAPLASTY     THYROIDECTOMY  10/30/08   [facility]    Family History  Problem Relation Age of Onset   Hypertension Mother    Coronary artery disease Father    Stroke Father    Dementia Maternal Grandmother    Diabetes Maternal Aunt    Lung cancer Other        great uncle    Social History   Socioeconomic History   Marital status: Single    Spouse name: n/a   Number of children: 0   Years of education: Master's   Highest education level: Not on file  Occupational History   Occupation: Wellsite Geologist: [employer]  Tobacco Use   Smoking status: Never   Smokeless tobacco: Never  Substance and Sexual Activity   Alcohol use: Yes    Alcohol/week: 3.0 standard drinks of alcohol    Types: 3 Standard drinks or equivalent per week   Drug use: No   Sexual activity: Yes    Partners: Male    Birth control/protection: Condom  Other Topics Concern   Not on file  Social History Narrative   PhD -- Lives alone.   Social Drivers of Health   Tobacco Use: Low Risk (08/26/2024)   Patient History    Smoking Tobacco Use: Never    Smokeless Tobacco Use: Never    Passive Exposure: Not on file  Financial Resource Strain: Not on file  Food Insecurity: Not on file  Transportation Needs: Not on file  Physical Activity: Not on file  Stress:  Not on file  Social Connections: Not on file  Intimate Partner Violence: Not on file  Depression (PHQ2-9): High Risk (08/26/2024)   Depression (PHQ2-9)    PHQ-2 Score: 21  Alcohol Screen: Not on file  Housing: Not on file  Utilities: Not on file  Health Literacy: Not on file    Outpatient Medications Prior to Visit  Medication Sig Dispense Refill   azelastine  (ASTELIN ) 0.1 % nasal spray Place 1 spray into both nostrils 2 (two) times daily. Use in each nostril as directed 30 mL 12   levocetirizine (XYZAL ) 5 MG tablet Take 1 tablet (5 mg total) by mouth every evening. 30 tablet 5   levothyroxine  (SYNTHROID ) 150 MCG tablet TAKE 1 TABLET BY MOUTH EVERY DAY 90 tablet 0   Multiple Vitamin (MULTIVITAMIN) tablet Take 1 tablet by mouth daily.     topiramate (TOPAMAX) 25 MG tablet Take by mouth.     sertraline  (ZOLOFT ) 25 MG tablet Take 1 tablet (25 mg total) by mouth daily. 90 tablet 3   metroNIDAZOLE  (FLAGYL ) 500 MG tablet Take 1 tablet (500 mg total) by mouth 2 (  two) times daily with a meal. DO NOT CONSUME ALCOHOL WHILE TAKING THIS MEDICATION. 14 tablet 0   Facility-Administered Medications Prior to Visit  Medication Dose Route Frequency Provider Last Rate Last Admin   acetaminophen  (TYLENOL ) tablet 650 mg  650 mg Oral Q6H PRN Lowne [name], Amarionna Arca R, DO   650 mg at 08/08/23 1523    Allergies[1]  Review of Systems  Constitutional:  Negative for fever and malaise/fatigue.  HENT:  Negative for congestion.   Eyes:  Negative for blurred vision.  Respiratory:  Negative for shortness of breath.   Cardiovascular:  Negative for chest pain, palpitations and leg swelling.  Gastrointestinal:  Negative for abdominal pain, blood in stool and nausea.  Genitourinary:  Negative for dysuria and frequency.  Musculoskeletal:  Negative for falls.  Skin:  Negative for rash.  Neurological:  Negative for dizziness, loss of consciousness and headaches.  Endo/Heme/Allergies:  Negative for environmental  allergies.  Psychiatric/Behavioral:  Negative for depression. The patient is not nervous/anxious.        Objective:    Physical Exam Vitals and nursing note reviewed.  Constitutional:      General: She is not in acute distress.    Appearance: Normal appearance. She is well-developed.  HENT:     Head: Normocephalic and atraumatic.     Right Ear: Tympanic membrane, ear canal and external ear normal. There is no impacted cerumen.     Left Ear: Tympanic membrane, ear canal and external ear normal. There is no impacted cerumen.     Nose: Nose normal.     Mouth/Throat:     Mouth: Mucous membranes are moist.     Pharynx: Oropharynx is clear. No oropharyngeal exudate or posterior oropharyngeal erythema.  Eyes:     General: No scleral icterus.       Right eye: No discharge.        Left eye: No discharge.     Conjunctiva/sclera: Conjunctivae normal.     Pupils: Pupils are equal, round, and reactive to light.  Neck:     Thyroid : No thyromegaly or thyroid  tenderness.     Vascular: No JVD.  Cardiovascular:     Rate and Rhythm: Normal rate and regular rhythm.     Heart sounds: Normal heart sounds. No murmur heard. Pulmonary:     Effort: Pulmonary effort is normal. No respiratory distress.     Breath sounds: Normal breath sounds.  Abdominal:     General: Bowel sounds are normal. There is no distension.     Palpations: Abdomen is soft. There is no mass.     Tenderness: There is no abdominal tenderness. There is no guarding or rebound.  Genitourinary:    Vagina: Normal.  Musculoskeletal:        General: Normal range of motion.     Cervical back: Normal range of motion and neck supple.     Right lower leg: No edema.     Left lower leg: No edema.  Lymphadenopathy:     Cervical: No cervical adenopathy.  Skin:    General: Skin is warm and dry.     Findings: No erythema or rash.  Neurological:     Mental Status: She is alert and oriented to person, place, and time.     Cranial Nerves:  No cranial nerve deficit.     Deep Tendon Reflexes: Reflexes are normal and symmetric.  Psychiatric:        Mood and Affect: Mood normal.  Behavior: Behavior normal.        Thought Content: Thought content normal.        Judgment: Judgment normal.     BP (!) 132/100 (BP Location: Left Arm, Patient Position: Sitting, Cuff Size: Large)   Pulse 83   Temp 98.3 F (36.8 C) (Oral)   Resp 18   Ht 5' 9 (1.753 m)   Wt 231 lb (104.8 kg)   LMP 07/17/2024   SpO2 97%   BMI 34.11 kg/m  Wt Readings from Last 3 Encounters:  08/26/24 231 lb (104.8 kg)  08/08/23 223 lb (101.2 kg)  02/10/22 240 lb 3.2 oz (109 kg)    Diabetic Foot Exam - Simple   No data filed    Lab Results  Component Value Date   WBC 7.3 08/08/2023   HGB 11.8 (L) 08/08/2023   HCT 36.5 08/08/2023   PLT 334.0 08/08/2023   GLUCOSE 70 08/08/2023   CHOL 210 (H) 08/08/2023   TRIG 83.0 08/08/2023   HDL 80.40 08/08/2023   LDLCALC 113 (H) 08/08/2023   ALT 8 08/08/2023   AST 13 08/08/2023   NA 140 08/08/2023   K 4.1 08/08/2023   CL 104 08/08/2023   CREATININE 0.82 08/08/2023   BUN 18 08/08/2023   CO2 29 08/08/2023   TSH 7.00 (H) 08/08/2023   HGBA1C 5.9 12/17/2020    Lab Results  Component Value Date   TSH 7.00 (H) 08/08/2023   Lab Results  Component Value Date   WBC 7.3 08/08/2023   HGB 11.8 (L) 08/08/2023   HCT 36.5 08/08/2023   MCV 86.1 08/08/2023   PLT 334.0 08/08/2023   Lab Results  Component Value Date   NA 140 08/08/2023   K 4.1 08/08/2023   CO2 29 08/08/2023   GLUCOSE 70 08/08/2023   BUN 18 08/08/2023   CREATININE 0.82 08/08/2023   BILITOT 0.3 08/08/2023   ALKPHOS 68 08/08/2023   AST 13 08/08/2023   ALT 8 08/08/2023   PROT 6.8 08/08/2023   ALBUMIN 4.2 08/08/2023   CALCIUM 9.0 08/08/2023   GFR 87.16 08/08/2023   Lab Results  Component Value Date   CHOL 210 (H) 08/08/2023   Lab Results  Component Value Date   HDL 80.40 08/08/2023   Lab Results  Component Value Date    LDLCALC 113 (H) 08/08/2023   Lab Results  Component Value Date   TRIG 83.0 08/08/2023   Lab Results  Component Value Date   CHOLHDL 3 08/08/2023   Lab Results  Component Value Date   HGBA1C 5.9 12/17/2020       Assessment & Plan:  Preventative health care -     Cytology - PAP -     CBC with Differential/Platelet -     Comprehensive metabolic panel with GFR -     Lipid panel -     TSH  Anxiety disorder, unspecified -     Sertraline  HCl; Take 1 tablet (50 mg total) by mouth daily.  Dispense: 90 tablet; Refill: 3  Attention deficit disorder, unspecified type -     Ambulatory referral to Behavioral Health  Blurry vision -     Ambulatory referral to Ophthalmology  Pain of left heel -     Ambulatory referral to Podiatry  Assessment and Plan Assessment & Plan     Erin JONELLE Shanks Chase, DO     [1]  Allergies Allergen Reactions   Shellfish Allergy  Anaphylaxis   Sulfonamide Derivatives

## 2024-08-26 NOTE — Assessment & Plan Note (Signed)
 Ghm utd Check labs See [facility] Health Maintenance  Topic Date Due   Pneumococcal Vaccine (1 of 2 - PCV) Never done   Hepatitis B Vaccines 19-59 Average Risk (1 of 3 - 19+ 3-dose series) Never done   HPV VACCINES (1 - 3-dose SCDM series) Never done   Mammogram  10/27/2021   Cervical Cancer Screening (HPV/Pap Cotest)  12/18/2023   COVID-19 Vaccine (5 - 2025-26 season) 05/13/2024   Colonoscopy  Never done   DTaP/Tdap/Td (3 - Td or Tdap) 07/26/2028   Influenza Vaccine  Completed   Hepatitis C Screening  Completed   HIV Screening  Completed   Meningococcal B Vaccine  Aged Out

## 2024-08-27 ENCOUNTER — Ambulatory Visit: Payer: Self-pay | Admitting: Family Medicine

## 2024-08-27 LAB — COMPREHENSIVE METABOLIC PANEL WITH GFR
ALT: 8 U/L (ref 0–35)
AST: 11 U/L (ref 5–37)
Albumin: 4.3 g/dL (ref 3.5–5.2)
Alkaline Phosphatase: 65 U/L (ref 39–117)
BUN: 17 mg/dL (ref 6–23)
CO2: 29 meq/L (ref 19–32)
Calcium: 9.1 mg/dL (ref 8.4–10.5)
Chloride: 104 meq/L (ref 96–112)
Creatinine, Ser: 0.84 mg/dL (ref 0.40–1.20)
GFR: 84.05 mL/min (ref >60.00)
Glucose, Bld: 85 mg/dL (ref 70–99)
Potassium: 4 meq/L (ref 3.5–5.1)
Sodium: 141 meq/L (ref 135–145)
Total Bilirubin: 0.2 mg/dL (ref 0.2–1.2)
Total Protein: 6.9 g/dL (ref 6.0–8.3)

## 2024-08-27 LAB — CBC WITH DIFFERENTIAL/PLATELET
Basophils Absolute: 0.1 K/uL (ref 0.0–0.1)
Basophils Relative: 0.7 % (ref 0.0–3.0)
Eosinophils Absolute: 0.1 K/uL (ref 0.0–0.7)
Eosinophils Relative: 0.7 % (ref 0.0–5.0)
HCT: 34.7 % — ABNORMAL LOW (ref 36.0–46.0)
Hemoglobin: 11.6 g/dL — ABNORMAL LOW (ref 12.0–15.0)
Lymphocytes Relative: 25 % (ref 12.0–46.0)
Lymphs Abs: 2 K/uL (ref 0.7–4.0)
MCHC: 33.5 g/dL (ref 30.0–36.0)
MCV: 80.9 fl (ref 78.0–100.0)
Monocytes Absolute: 0.5 K/uL (ref 0.1–1.0)
Monocytes Relative: 6.1 % (ref 3.0–12.0)
Neutro Abs: 5.3 K/uL (ref 1.4–7.7)
Neutrophils Relative %: 67.5 % (ref 43.0–77.0)
Platelets: 276 K/uL (ref 150.0–400.0)
RBC: 4.28 Mil/uL (ref 3.87–5.11)
RDW: 16 % — ABNORMAL HIGH (ref 11.5–15.5)
WBC: 7.8 K/uL (ref 4.0–10.5)

## 2024-08-27 LAB — LIPID PANEL
Cholesterol: 204 mg/dL — ABNORMAL HIGH (ref 28–200)
HDL: 73.3 mg/dL (ref >39.00)
LDL Cholesterol: 107 mg/dL — ABNORMAL HIGH (ref 0–99)
NonHDL: 130.84
Total CHOL/HDL Ratio: 3
Triglycerides: 118 mg/dL (ref 0.0–149.0)
VLDL: 23.6 mg/dL (ref 0.0–40.0)

## 2024-08-27 LAB — TSH: TSH: 5.33 u[IU]/mL (ref 0.35–5.50)

## 2024-08-29 LAB — CYTOLOGY - PAP: Diagnosis: NEGATIVE

## 2024-09-16 ENCOUNTER — Ambulatory Visit: Admitting: Podiatry

## 2024-09-23 ENCOUNTER — Ambulatory Visit

## 2024-09-23 ENCOUNTER — Ambulatory Visit (INDEPENDENT_AMBULATORY_CARE_PROVIDER_SITE_OTHER)

## 2024-09-23 ENCOUNTER — Ambulatory Visit: Admitting: Podiatry

## 2024-09-23 DIAGNOSIS — M722 Plantar fascial fibromatosis: Secondary | ICD-10-CM

## 2024-09-23 DIAGNOSIS — M79671 Pain in right foot: Secondary | ICD-10-CM

## 2024-09-23 DIAGNOSIS — M7732 Calcaneal spur, left foot: Secondary | ICD-10-CM | POA: Diagnosis not present

## 2024-09-23 DIAGNOSIS — M7662 Achilles tendinitis, left leg: Secondary | ICD-10-CM

## 2024-09-23 DIAGNOSIS — M7731 Calcaneal spur, right foot: Secondary | ICD-10-CM

## 2024-09-23 DIAGNOSIS — M79672 Pain in left foot: Secondary | ICD-10-CM

## 2024-09-23 MED ORDER — MELOXICAM 15 MG PO TABS: 15.0000 mg | ORAL_TABLET | Freq: Every day | ORAL | 2 refills | Status: AC | PRN

## 2024-09-23 NOTE — Progress Notes (Unsigned)
 Subjective:   Patient ID: Erin [name], female   DOB: 44 y.o.   MRN: [id number]   HPI Chief Complaint  Patient presents with   Foot Pain    Patient presents today for B/L Foot pain ongoing for about 6 months patient does not recall any injury to area .   44 year old female presents the [place] above concerns.  She gets pain on the arches of both feet and a knot is present but also on the left Achilles tendon that she points to.  This started around the end of the school year last year.  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  No injuries that she reports.  She states that her symptoms worsen as the day, week goes on and is better with rest.   Review of Systems  All other systems reviewed and are negative.  Past Medical History:  Diagnosis Date   Abortion history    x3   Asthma    Elevated blood pressure    Hypertension     Past Surgical History:  Procedure Laterality Date   BREAST SURGERY     Breast reduction   INDUCED ABORTION     x3   REDUCTION MAMMAPLASTY     THYROIDECTOMY  10/30/08   [facility]    Current Medications[1]  Allergies[2]        Objective:  Physical Exam  General: AAO x3, NAD  Dermatological: Skin is warm, dry and supple bilateral. There are no open sores, no preulcerative lesions, no rash or signs of infection present.  Vascular: Dorsalis Pedis artery and Posterior Tibial artery pedal pulses are 2/4 bilateral with immedate capillary fill time. There is no pain with calf compression, swelling, warmth, erythema.   Neruologic: Grossly intact via light touch bilateral.   Musculoskeletal: On the left side worse than right there is a palpable nodule present on the medial band of plantar fascia consistent with a plantar fibroma.  Plantar fascia appears to be intact.  On the left side there is tenderness on the distal portion of the Achilles tendon and on the insertion into the calcaneus.   There is no pain with lateral compression of the calcaneus.  Equinus is present.  No area pinpoint tenderness.  MMT 5/5.       Assessment:   Plantar fibromatosis bilaterally, left Achilles tendinitis     Plan:  -Treatment options discussed including all alternatives, risks, and complications -Etiology of symptoms were discussed -X-rays were obtained and reviewed with the patient.  Multiple views of bilateral feet were obtained.  Nones of acute fracture.  Calcaneal spurring is present. -Prescribed mobic . Discussed side effects of the medication and directed to stop if any are to occur and call the office.  Discussed side effects with her medications and watch for any GI side effects. - Discussed stretching, icing on a regular basis - Discussed shoes, good arch support - Consider steroid injection to the arches of the feet if needed - Night splint was dispensed. Static or dynamic ankle foot orthosis, including soft interface material, adjustable for fit, for positioning, may be used for minimal ambulation, prefabricated, off-the-shelf was dispensed on the billed date of service   Return in about 6 weeks (around 11/04/2024), or if symptoms worsen or fail to improve.  Donnice JONELLE Fees DPM           [1]  Current Outpatient Medications:    azelastine  (ASTELIN )  0.1 % nasal spray, Place 1 spray into both nostrils 2 (two) times daily. Use in each nostril as directed, Disp: 30 mL, Rfl: 12   drospirenone -ethinyl estradiol  (YAZ) 3-0.02 MG tablet, Take 1 tablet by mouth daily., Disp: 28 tablet, Rfl: 11   levocetirizine (XYZAL ) 5 MG tablet, Take 1 tablet (5 mg total) by mouth every evening., Disp: 30 tablet, Rfl: 5   levothyroxine  (SYNTHROID ) 150 MCG tablet, TAKE 1 TABLET BY MOUTH EVERY DAY, Disp: 90 tablet, Rfl: 0   meloxicam  (MOBIC ) 15 MG tablet, Take 1 tablet (15 mg total) by mouth daily as needed for pain., Disp: 30 tablet, Rfl: 2   Multiple Vitamin (MULTIVITAMIN) tablet, Take 1 tablet by  mouth daily., Disp: , Rfl:    sertraline  (ZOLOFT ) 50 MG tablet, Take 1 tablet (50 mg total) by mouth daily., Disp: 90 tablet, Rfl: 3   topiramate (TOPAMAX) 25 MG tablet, Take by mouth., Disp: , Rfl:   Current Facility-Administered Medications:    acetaminophen  (TYLENOL ) tablet 650 mg, 650 mg, Oral, Q6H PRN, Lowne Chase, Yvonne R, DO, 650 mg at 08/08/23 1523 [2]  Allergies Allergen Reactions   Shellfish Allergy  Anaphylaxis   Sulfonamide Derivatives

## 2024-09-23 NOTE — Patient Instructions (Signed)
 Achilles Tendinitis  with Rehab Achilles tendinitis is a disorder of the Achilles tendon. The Achilles tendon connects the large calf muscles (Gastrocnemius and Soleus) to the heel bone (calcaneus). This tendon is sometimes called the heel cord. It is important for pushing-off and standing on your toes and is important for walking, running, or jumping. Tendinitis is often caused by overuse and repetitive microtrauma. SYMPTOMS Pain, tenderness, swelling, warmth, and redness may occur over the Achilles tendon even at rest. Pain with pushing off, or flexing or extending the ankle. Pain that is worsened after or during activity. CAUSES  Overuse sometimes seen with rapid increase in exercise programs or in sports requiring running and jumping. Poor physical conditioning (strength and flexibility or endurance). Running sports, especially training running down hills. Inadequate warm-up before practice or play or failure to stretch before participation. Injury to the tendon. PREVENTION  Warm up and stretch before practice or competition. Allow time for adequate rest and recovery between practices and competition. Keep up conditioning. Keep up ankle and leg flexibility. Improve or keep muscle strength and endurance. Improve cardiovascular fitness. Use proper technique. Use proper equipment (shoes, skates). To help prevent recurrence, taping, protective strapping, or an adhesive bandage may be recommended for several weeks after healing is complete. PROGNOSIS  Recovery may take weeks to several months to heal. Longer recovery is expected if symptoms have been prolonged. Recovery is usually quicker if the inflammation is due to a direct blow as compared with overuse or sudden strain. RELATED COMPLICATIONS  Healing time will be prolonged if the condition is not correctly treated. The injury must be given plenty of time to heal. Symptoms can reoccur if activity is resumed too soon. Untreated,  tendinitis may increase the risk of tendon rupture requiring additional time for recovery and possibly surgery. TREATMENT  The first treatment consists of rest anti-inflammatory medication, and ice to relieve the pain. Stretching and strengthening exercises after resolution of pain will likely help reduce the risk of recurrence. Referral to a physical therapist or athletic trainer for further evaluation and treatment may be helpful. A walking boot or cast may be recommended to rest the Achilles tendon. This can help break the cycle of inflammation and microtrauma. Arch supports (orthotics) may be prescribed or recommended by your caregiver as an adjunct to therapy and rest. Surgery to remove the inflamed tendon lining or degenerated tendon tissue is rarely necessary and has shown less than predictable results. MEDICATION  Nonsteroidal anti-inflammatory medications, such as aspirin and ibuprofen, may be used for pain and inflammation relief. Do not take within 7 days before surgery. Take these as directed by your caregiver. Contact your caregiver immediately if any bleeding, stomach upset, or signs of allergic reaction occur. Other minor pain relievers, such as acetaminophen , may also be used. Pain relievers may be prescribed as necessary by your caregiver. Do not take prescription pain medication for longer than 4 to 7 days. Use only as directed and only as much as you need. Cortisone injections are rarely indicated. Cortisone injections may weaken tendons and predispose to rupture. It is better to give the condition more time to heal than to use them. HEAT AND COLD Cold is used to relieve pain and reduce inflammation for acute and chronic Achilles tendinitis. Cold should be applied for 10 to 15 minutes every 2 to 3 hours for inflammation and pain and immediately after any activity that aggravates your symptoms. Use ice packs or an ice massage. Heat may be used before performing stretching  and  strengthening activities prescribed by your caregiver. Use a heat pack or a warm soak. SEEK MEDICAL CARE IF: Symptoms get worse or do not improve in 2 weeks despite treatment. New, unexplained symptoms develop. Drugs used in treatment may produce side effects.  EXERCISES:  RANGE OF MOTION (ROM) AND STRETCHING EXERCISES - Achilles Tendinitis  These exercises may help you when beginning to rehabilitate your injury. Your symptoms may resolve with or without further involvement from your physician, physical therapist or athletic trainer. While completing these exercises, remember:  Restoring tissue flexibility helps normal motion to return to the joints. This allows healthier, less painful movement and activity. An effective stretch should be held for at least 30 seconds. A stretch should never be painful. You should only feel a gentle lengthening or release in the stretched tissue.  STRETCH  Gastroc, Standing  Place hands on wall. Extend right / left leg, keeping the front knee somewhat bent. Slightly point your toes inward on your back foot. Keeping your right / left heel on the floor and your knee straight, shift your weight toward the wall, not allowing your back to arch. You should feel a gentle stretch in the right / left calf. Hold this position for 10 seconds. Repeat 3 times. Complete this stretch 2 times per day.  STRETCH  Soleus, Standing  Place hands on wall. Extend right / left leg, keeping the other knee somewhat bent. Slightly point your toes inward on your back foot. Keep your right / left heel on the floor, bend your back knee, and slightly shift your weight over the back leg so that you feel a gentle stretch deep in your back calf. Hold this position for 10 seconds. Repeat 3 times. Complete this stretch 2 times per day.  STRETCH  Gastrocsoleus, Standing  Note: This exercise can place a lot of stress on your foot and ankle. Please complete this exercise only if specifically  instructed by your caregiver.  Place the ball of your right / left foot on a step, keeping your other foot firmly on the same step. Hold on to the wall or a rail for balance. Slowly lift your other foot, allowing your body weight to press your heel down over the edge of the step. You should feel a stretch in your right / left calf. Hold this position for 10 seconds. Repeat this exercise with a slight bend in your knee. Repeat 3 times. Complete this stretch 2 times per day.   STRENGTHENING EXERCISES - Achilles Tendinitis These exercises may help you when beginning to rehabilitate your injury. They may resolve your symptoms with or without further involvement from your physician, physical therapist or athletic trainer. While completing these exercises, remember:  Muscles can gain both the endurance and the strength needed for everyday activities through controlled exercises. Complete these exercises as instructed by your physician, physical therapist or athletic trainer. Progress the resistance and repetitions only as guided. You may experience muscle soreness or fatigue, but the pain or discomfort you are trying to eliminate should never worsen during these exercises. If this pain does worsen, stop and make certain you are following the directions exactly. If the pain is still present after adjustments, discontinue the exercise until you can discuss the trouble with your clinician.  STRENGTH - Plantar-flexors  Sit with your right / left leg extended. Holding onto both ends of a rubber exercise band/tubing, loop it around the ball of your foot. Keep a slight tension in the band. Slowly  push your toes away from you, pointing them downward. Hold this position for 10 seconds. Return slowly, controlling the tension in the band/tubing. Repeat 3 times. Complete this exercise 2 times per day.   STRENGTH - Plantar-flexors  Stand with your feet shoulder width apart. Steady yourself with a wall or table  using as little support as needed. Keeping your weight evenly spread over the width of your feet, rise up on your toes.* Hold this position for 10 seconds. Repeat 3 times. Complete this exercise 2 times per day.  *If this is too easy, shift your weight toward your right / left leg until you feel challenged. Ultimately, you may be asked to do this exercise with your right / left foot only.  STRENGTH  Plantar-flexors, Eccentric  Note: This exercise can place a lot of stress on your foot and ankle. Please complete this exercise only if specifically instructed by your caregiver.  Place the balls of your feet on a step. With your hands, use only enough support from a wall or rail to keep your balance. Keep your knees straight and rise up on your toes. Slowly shift your weight entirely to your right / left toes and pick up your opposite foot. Gently and with controlled movement, lower your weight through your right / left foot so that your heel drops below the level of the step. You will feel a slight stretch in the back of your calf at the end position. Use the healthy leg to help rise up onto the balls of both feet, then lower weight only on the right / left leg again. Build up to 15 repetitions. Then progress to 3 consecutive sets of 15 repetitions.* After completing the above exercise, complete the same exercise with a slight knee bend (about 30 degrees). Again, build up to 15 repetitions. Then progress to 3 consecutive sets of 15 repetitions.* Perform this exercise 2 times per day.  *When you easily complete 3 sets of 15, your physician, physical therapist or athletic trainer may advise you to add resistance by wearing a backpack filled with additional weight.  STRENGTH - Plantar Flexors, Seated  Sit on a chair that allows your feet to rest flat on the ground. If necessary, sit at the edge of the chair. Keeping your toes firmly on the ground, lift your right / left heel as far as you can without  increasing any discomfort in your ankle. Repeat 3 times. Complete this exercise 2 times a day.  -   Meloxicam  Tablets What is this medication? MELOXICAM  (mel OX i cam) treats mild to moderate pain, inflammation, or arthritis. It works by decreasing inflammation. It belongs to a group of medications called NSAIDs. This medicine may be used for other purposes; ask your health care provider or pharmacist if you have questions. COMMON BRAND NAME(S): Mobic  What should I tell my care team before I take this medication? They need to know if you have any of these conditions: Asthma Bleeding problems Dehydration Frequently drink alcohol Have had a heart attack, stroke, or mini-stroke Heart bypass surgery, or CABG, within the past 2 weeks Heart or blood vessel conditions Heart failure High blood pressure Kidney disease Liver disease Stomach bleeding Stomach ulcers, other stomach or intestine problems Tobacco use An unusual or allergic reaction to meloxicam , other medications, foods, dyes, or preservatives Pregnant or trying to get pregnant Breastfeeding How should I use this medication? Take this medication by mouth. Take it as directed on the prescription label at the  same time every day. You can take it with or without food. If it upsets your stomach, take it with food. Do not use it more often than directed. There may be unused or extra doses in the bottle after you finish your treatment. Talk to your care team if you have questions about your dose. A special MedGuide will be given to you by the pharmacist with each prescription and refill. Be sure to read this information carefully each time. Talk to your care team about the use of this medication in children. Special care may be needed. People over 50 years of age may have a stronger reaction and need a smaller dose. Overdosage: If you think you have taken too much of this medicine contact a poison control center or emergency room at  once. NOTE: This medicine is only for you. Do not share this medicine with others. What if I miss a dose? If you miss a dose, take it as soon as you can. If it is almost time for your next dose, take only that dose. Do not take double or extra doses. What may interact with this medication? Do not take this medication with any of the following: Cidofovir Ketorolac This medication may also interact with the following: Alcohol Aspirin and aspirin-like medications Blood thinners Cyclosporine Digoxin Diuretics Lithium Medications for high blood pressure Methotrexate Other NSAIDs, medications for pain and inflammation, such as ibuprofen or naproxen  Some medications for depression Steroid medications, such as prednisone  or cortisone Supplements, such as garlic, ginger, ginkgo, methylsulfonylmethane (MSM) This list may not describe all possible interactions. Give your health care provider a list of all the medicines, herbs, non-prescription drugs, or dietary supplements you use. Also tell them if you smoke, drink alcohol, or use illegal drugs. Some items may interact with your medicine. What should I watch for while using this medication? Visit your care team for regular checks on your progress. Tell your care team if your symptoms do not start to get better or if they get worse. Do not take aspirin or other NSAIDs, such as ibuprofen or naproxen , while you are taking this medication. Side effects, such as upset stomach, nausea, and ulcers, may be more likely to occur. Many over-the-counter medications contain aspirin, ibuprofen, or naproxen . It is important to read labels carefully. Talk to your care team about all the medications you take. They can tell you what is safe to take together. This medication can cause serious bleeding, ulcers, or tears in the stomach. These problems can occur at any time and with no warning signs. They are more common with long-term use. Talk to your care team right  away if you have stomach pain, bloody or black, tar-like stools, or vomit blood that is red or looks like coffee grounds. This medication increases the risk of blood clots, heart attack, and stroke. These events can occur at any time. They are more common with long-term use and in those who have heart disease. If you take aspirin to prevent a heart attack or stroke, talk to your care team. They can help you find an option that works for you. This medication may cause serious skin reactions. They can happen weeks to months after starting the medication. Talk to your care team right away if you have fevers or flu-like symptoms with a rash. The rash may be red or purple and then turn into blisters or peeling of the skin. Or you might notice a red rash with swelling of the face, lips,  or lymph nodes in your neck or under your arms. Talk to your care team if you may be pregnant. Taking this medication after 20 weeks of pregnancy may cause serious birth defects. Use of this medication after 30 weeks of pregnancy is not recommended. This medication may cause infertility. It is usually temporary. Talk to your care team if you are concerned about your fertility. What side effects may I notice from receiving this medication? Side effects that you should report to your care team as soon as possible: Allergic reactions--skin rash, itching, hives, swelling of the face, lips, tongue, or throat Bleeding--bloody or black, tar-like stools, vomiting blood or brown material that looks like coffee grounds, red or dark brown urine, small red or purple spots on skin, unusual bruising or bleeding Heart attack--pain or tightness in the chest, shoulders, arms, or jaw, nausea, shortness of breath, cold or clammy skin, feeling faint or lightheaded Heart failure--shortness of breath, swelling of the ankles, feet, or hands, sudden weight gain, unusual weakness or fatigue Increase in blood pressure Kidney injury--decrease in the  amount of urine, swelling of the ankles, hands, or feet Liver injury--right upper belly pain, loss of appetite, nausea, light-colored stool, dark yellow or brown urine, yellowing skin or eyes, unusual weakness or fatigue Rash, fever, and swollen lymph nodes Redness, blistering, peeling, or loosening of the skin, including inside the mouth Round red or dark patches on the skin that may itch, burn, and blister Stroke--sudden numbness or weakness of the face, arm, or leg, trouble speaking, confusion, trouble walking, loss of balance or coordination, dizziness, severe headache, change in vision Side effects that usually do not require medical attention (report these to your care team if they continue or are bothersome): Headache Loss of appetite Nausea Upset stomach This list may not describe all possible side effects. Call your doctor for medical advice about side effects. You may report side effects to FDA at 1-800-FDA-1088. Where should I keep my medication? Keep out of the reach of children and pets. Store at room temperature between 20 and 25 degrees C (68 and 77 degrees F). Protect from moisture. Keep the container tightly closed. Get rid of any unused medication after the expiration date. To get rid of medications that are no longer needed or have expired: Take the medication to a medication take-back program. Check with your pharmacy or law enforcement to find a location. If you cannot return the medication, check the label or package insert to see if the medication should be thrown out in the garbage or flushed down the toilet. If you are not sure, ask your care team. If it is safe to put it in the trash, empty the medication out of the container. Mix the medication with cat litter, dirt, coffee grounds, or other unwanted substance. Seal the mixture in a bag or container. Put it in the trash. NOTE: This sheet is a summary. It may not cover all possible information. If you have questions about  this medicine, talk to your doctor, pharmacist, or health care provider.  2025 [employer] Standard (2023-11-07 00:00:00)

## 2024-10-15 ENCOUNTER — Encounter: Payer: Self-pay | Admitting: Gastroenterology
